# Patient Record
Sex: Female | Born: 1935
Health system: Southern US, Community
[De-identification: ages and names within clinical notes are randomized; demographics above are authoritative.]

## PROBLEM LIST (undated history)

## (undated) ENCOUNTER — Emergency Department (HOSPITAL_COMMUNITY): Disposition: A | Discharge status: Home / Self Care | Payer: No Typology Code available for payment source

## (undated) DIAGNOSIS — Z9889 Other specified postprocedural states: Secondary | ICD-10-CM

## (undated) DIAGNOSIS — R6883 Chills (without fever): Secondary | ICD-10-CM

## (undated) DIAGNOSIS — D689 Coagulation defect, unspecified: Secondary | ICD-10-CM

## (undated) DIAGNOSIS — R197 Diarrhea, unspecified: Secondary | ICD-10-CM

## (undated) DIAGNOSIS — R112 Nausea with vomiting, unspecified: Secondary | ICD-10-CM

## (undated) DIAGNOSIS — I10 Essential (primary) hypertension: Secondary | ICD-10-CM

## (undated) DIAGNOSIS — R238 Other skin changes: Secondary | ICD-10-CM

## (undated) DIAGNOSIS — M7989 Other specified soft tissue disorders: Secondary | ICD-10-CM

## (undated) DIAGNOSIS — C50919 Malignant neoplasm of unspecified site of unspecified female breast: Secondary | ICD-10-CM

## (undated) DIAGNOSIS — D649 Anemia, unspecified: Secondary | ICD-10-CM

## (undated) DIAGNOSIS — R0602 Shortness of breath: Secondary | ICD-10-CM

## (undated) DIAGNOSIS — R51 Headache: Secondary | ICD-10-CM

## (undated) DIAGNOSIS — M1990 Unspecified osteoarthritis, unspecified site: Secondary | ICD-10-CM

## (undated) DIAGNOSIS — E079 Disorder of thyroid, unspecified: Secondary | ICD-10-CM

## (undated) HISTORY — DX: Other specified soft tissue disorders: M79.89

## (undated) HISTORY — PX: EYE SURGERY: SHX253

## (undated) HISTORY — DX: Chills (without fever): R68.83

## (undated) HISTORY — DX: Other skin changes: R23.8

## (undated) HISTORY — DX: Malignant neoplasm of unspecified site of unspecified female breast: C50.919

## (undated) HISTORY — DX: Diarrhea, unspecified: R19.7

## (undated) HISTORY — DX: Disorder of thyroid, unspecified: E07.9

## (undated) HISTORY — DX: Coagulation defect, unspecified: D68.9

## (undated) HISTORY — DX: Headache: R51

## (undated) HISTORY — DX: Anemia, unspecified: D64.9

## (undated) HISTORY — DX: Unspecified osteoarthritis, unspecified site: M19.90

## (undated) HISTORY — DX: Essential (primary) hypertension: I10

---

## 1958-05-08 HISTORY — PX: VEIN LIGATION AND STRIPPING: SHX2653

## 1968-05-08 HISTORY — PX: CHOLECYSTECTOMY: SHX55

## 1973-05-08 HISTORY — PX: UTERINE FIBROID SURGERY: SHX826

## 1998-05-08 HISTORY — PX: ANKLE SURGERY: SHX546

## 1998-05-08 HISTORY — PX: WRIST SURGERY: SHX841

## 1998-08-25 ENCOUNTER — Encounter: Payer: Self-pay | Admitting: Orthopaedic Surgery

## 1998-08-25 ENCOUNTER — Inpatient Hospital Stay (HOSPITAL_COMMUNITY): Admission: AD | Admit: 1998-08-25 | Discharge: 1998-08-28 | Payer: Self-pay | Admitting: Orthopaedic Surgery

## 1999-09-25 ENCOUNTER — Emergency Department (HOSPITAL_COMMUNITY): Admission: EM | Admit: 1999-09-25 | Discharge: 1999-09-25 | Payer: Self-pay | Admitting: Emergency Medicine

## 1999-09-27 ENCOUNTER — Encounter: Admission: RE | Admit: 1999-09-27 | Discharge: 1999-09-27 | Payer: Self-pay | Admitting: Gastroenterology

## 1999-09-27 ENCOUNTER — Encounter: Payer: Self-pay | Admitting: Gastroenterology

## 1999-12-06 ENCOUNTER — Encounter: Payer: Self-pay | Admitting: Obstetrics and Gynecology

## 1999-12-06 ENCOUNTER — Encounter: Admission: RE | Admit: 1999-12-06 | Discharge: 1999-12-06 | Payer: Self-pay | Admitting: Obstetrics and Gynecology

## 2000-09-13 ENCOUNTER — Other Ambulatory Visit: Admission: RE | Admit: 2000-09-13 | Discharge: 2000-09-13 | Payer: Self-pay | Admitting: Obstetrics and Gynecology

## 2000-10-04 ENCOUNTER — Encounter: Payer: Self-pay | Admitting: Obstetrics and Gynecology

## 2000-10-04 ENCOUNTER — Encounter: Admission: RE | Admit: 2000-10-04 | Discharge: 2000-10-04 | Payer: Self-pay | Admitting: Obstetrics and Gynecology

## 2001-03-01 ENCOUNTER — Encounter: Admission: RE | Admit: 2001-03-01 | Discharge: 2001-03-01 | Payer: Self-pay | Admitting: Obstetrics and Gynecology

## 2001-03-01 ENCOUNTER — Encounter: Payer: Self-pay | Admitting: Obstetrics and Gynecology

## 2001-09-19 ENCOUNTER — Other Ambulatory Visit: Admission: RE | Admit: 2001-09-19 | Discharge: 2001-09-19 | Payer: Self-pay | Admitting: Gynecology

## 2002-02-25 ENCOUNTER — Encounter: Admission: RE | Admit: 2002-02-25 | Discharge: 2002-02-25 | Payer: Self-pay | Admitting: Gynecology

## 2002-02-25 ENCOUNTER — Encounter: Payer: Self-pay | Admitting: Gynecology

## 2002-10-28 ENCOUNTER — Other Ambulatory Visit: Admission: RE | Admit: 2002-10-28 | Discharge: 2002-10-28 | Payer: Self-pay | Admitting: Gynecology

## 2003-05-19 ENCOUNTER — Encounter: Admission: RE | Admit: 2003-05-19 | Discharge: 2003-05-19 | Payer: Self-pay | Admitting: Gynecology

## 2003-10-29 ENCOUNTER — Other Ambulatory Visit: Admission: RE | Admit: 2003-10-29 | Discharge: 2003-10-29 | Payer: Self-pay | Admitting: Gynecology

## 2004-09-23 ENCOUNTER — Emergency Department (HOSPITAL_COMMUNITY): Admission: EM | Admit: 2004-09-23 | Discharge: 2004-09-23 | Payer: Self-pay | Admitting: Emergency Medicine

## 2004-09-27 ENCOUNTER — Ambulatory Visit (HOSPITAL_BASED_OUTPATIENT_CLINIC_OR_DEPARTMENT_OTHER): Admission: RE | Admit: 2004-09-27 | Discharge: 2004-09-27 | Payer: Self-pay | Admitting: Orthopedic Surgery

## 2004-09-27 ENCOUNTER — Ambulatory Visit (HOSPITAL_COMMUNITY): Admission: RE | Admit: 2004-09-27 | Discharge: 2004-09-27 | Payer: Self-pay | Admitting: Orthopedic Surgery

## 2004-10-18 ENCOUNTER — Encounter: Admission: RE | Admit: 2004-10-18 | Discharge: 2004-10-18 | Payer: Self-pay | Admitting: Gynecology

## 2004-10-31 ENCOUNTER — Other Ambulatory Visit: Admission: RE | Admit: 2004-10-31 | Discharge: 2004-10-31 | Payer: Self-pay | Admitting: Gynecology

## 2005-12-01 ENCOUNTER — Encounter: Admission: RE | Admit: 2005-12-01 | Discharge: 2005-12-01 | Payer: Self-pay | Admitting: Gynecology

## 2005-12-01 ENCOUNTER — Other Ambulatory Visit: Admission: RE | Admit: 2005-12-01 | Discharge: 2005-12-01 | Payer: Self-pay | Admitting: Gynecology

## 2006-11-05 ENCOUNTER — Other Ambulatory Visit: Admission: RE | Admit: 2006-11-05 | Discharge: 2006-11-05 | Payer: Self-pay | Admitting: Gynecology

## 2006-12-04 ENCOUNTER — Encounter: Admission: RE | Admit: 2006-12-04 | Discharge: 2006-12-04 | Payer: Self-pay | Admitting: Gynecology

## 2007-12-10 ENCOUNTER — Encounter: Admission: RE | Admit: 2007-12-10 | Discharge: 2007-12-10 | Payer: Self-pay | Admitting: Gynecology

## 2009-02-22 ENCOUNTER — Encounter: Admission: RE | Admit: 2009-02-22 | Discharge: 2009-02-22 | Payer: Self-pay | Admitting: Gynecology

## 2010-05-28 ENCOUNTER — Other Ambulatory Visit: Payer: Self-pay | Admitting: Gynecology

## 2010-05-28 ENCOUNTER — Encounter: Payer: Self-pay | Admitting: Internal Medicine

## 2010-05-28 DIAGNOSIS — Z1239 Encounter for other screening for malignant neoplasm of breast: Secondary | ICD-10-CM

## 2010-05-29 ENCOUNTER — Encounter: Payer: Self-pay | Admitting: Gynecology

## 2010-05-30 ENCOUNTER — Encounter: Payer: Self-pay | Admitting: Gynecology

## 2010-06-22 ENCOUNTER — Ambulatory Visit: Payer: Self-pay

## 2010-06-29 ENCOUNTER — Ambulatory Visit: Payer: Self-pay

## 2010-07-28 ENCOUNTER — Ambulatory Visit
Admission: RE | Admit: 2010-07-28 | Discharge: 2010-07-28 | Disposition: A | Discharge status: Home / Self Care | Payer: Medicare Other | Source: Ambulatory Visit | Attending: Gynecology | Admitting: Gynecology

## 2010-07-28 DIAGNOSIS — Z1239 Encounter for other screening for malignant neoplasm of breast: Secondary | ICD-10-CM

## 2010-09-23 NOTE — Op Note (Signed)
NAME:  CHENAY, NESMITH                  ACCOUNT NO.:  [id number]   MEDICAL RECORD NO.:  [id number]          PATIENT TYPE:  AMB   LOCATION:  DSC                          FACILITY:  [facility]   PHYSICIAN:  Cindee Salt, M.D.       DATE OF BIRTH:  Jul 24, 1935   DATE OF PROCEDURE:  09/27/2004  DATE OF DISCHARGE:                                 OPERATIVE REPORT   PREOPERATIVE DIAGNOSIS:  Distal radius fracture, right wrist.   POSTOPERATIVE DIAGNOSIS:  Distal radius fracture, right wrist.   OPERATION:  Open reduction internal fixation, right distal radius fracture.   SURGEON:  Cindee Salt, M.D.   ASSISTANTCarolyne Fiscal, R.N.   ANESTHESIA:  General.   HISTORY:  The patient is a 75 year old female who suffered a fall from a  step ladder, suffering a comminuted intra-articular fracture of the distal  right radius. She is admitted now for open reduction internal fixation.   PROCEDURE:  The patient is brought to the operating room where a general  anesthetic was carried out without difficulty. She was prepped using  DuraPrep, supine position, right arm free.  The fracture was manipulated  reduced, checked under image intensification showing adequate reduction,  marked comminution of the fracture.  The limb was then exsanguinated with an  Esmarch bandage.  A tourniquet placed high on the arm was inflated to 250  mmHg. The volar incision was made over the flexor carpi radialis carried  down through subcutaneous tissue. Bleeders were electrocauterized.  Dissection carried through the flexor carpi radialis sheath.  The radial  artery identified and protected. The retractors were placed allowing  dissection of the FPL off from the pronator quadratus.  The pronator  quadratus was then incised on its radial aspect maintaining cuff tissue for  later repair.  The brachioradialis was then released. The fracture was  easily identified, minimally curetted and a regular right PDR plate was then  selected.  The  fracture maintained in position. This was then pinned with  multiple 45 K-wires. X-rays revealed adequate reduction of the fracture  fragment.  A Freer elevator was used to elevate the radial styloid  maintaining it in position with an articular surface being maintained.  A  screw was then placed into the variable hole.  This measured 16 mm. This  firmly fixed the proximal plate in position. The pin was removed proximally.  The distal plates and screws were then each inserted.  These measured  between 18 and 22 mm. These were each placed as either pegs or terminally  threaded pegs, locking fragment in position. AP lateral oblique x-rays  through the articular surface revealed pins were not in the articular  surface.  None were in the joint. The wrist was able to be placed through a  full range motion, full pronation, supination was afforded.  The proximal  screws were then placed.  These were each measured and tapped.  The most  distal screw measured 16, the 16 mm screw was noted be long and this was  replaced with a 14 and two 14 mm screws  placed proximally. The 16 mm screw  was then inserted distally.  X-rays AP lateral oblique revealed the fracture  reduced in both AP lateral oblique directions without significant bony  defect.  As such, bone graft was not used.  The wound was copiously  irrigated with saline. The pronator quadratus was then repaired with figure-  of-eight 4-0 Vicryl sutures, the subcutaneous tissue with 4-0 Vicryl and  skin with interrupted 5-0 nylon  sutures. A sterile dorsal palmar splint was applied. The patient tolerated  the procedure well and was taken to the recovery room for observation in  satisfactory condition. She is admitted for overnight stay. She will be  discharged on Percocet.      GK/MEDQ  D:  09/27/2004  T:  09/27/2004  Job:  [id number]

## 2011-03-29 ENCOUNTER — Other Ambulatory Visit: Payer: Self-pay | Admitting: Gynecology

## 2011-03-29 DIAGNOSIS — N63 Unspecified lump in unspecified breast: Secondary | ICD-10-CM

## 2011-04-12 ENCOUNTER — Ambulatory Visit
Admission: RE | Admit: 2011-04-12 | Discharge: 2011-04-12 | Disposition: A | Discharge status: Home / Self Care | Payer: Medicare Other | Source: Ambulatory Visit | Attending: Gynecology | Admitting: Gynecology

## 2011-04-12 ENCOUNTER — Other Ambulatory Visit: Payer: Self-pay | Admitting: Gynecology

## 2011-04-12 DIAGNOSIS — N63 Unspecified lump in unspecified breast: Secondary | ICD-10-CM

## 2011-04-21 ENCOUNTER — Other Ambulatory Visit: Payer: Self-pay | Admitting: Gynecology

## 2011-04-21 ENCOUNTER — Ambulatory Visit
Admission: RE | Admit: 2011-04-21 | Discharge: 2011-04-21 | Disposition: A | Discharge status: Home / Self Care | Payer: Medicare Other | Source: Ambulatory Visit | Attending: Gynecology | Admitting: Gynecology

## 2011-04-21 DIAGNOSIS — C50919 Malignant neoplasm of unspecified site of unspecified female breast: Secondary | ICD-10-CM | POA: Insufficient documentation

## 2011-04-21 DIAGNOSIS — N63 Unspecified lump in unspecified breast: Secondary | ICD-10-CM

## 2011-04-21 HISTORY — DX: Malignant neoplasm of unspecified site of unspecified female breast: C50.919

## 2011-04-24 ENCOUNTER — Telehealth (INDEPENDENT_AMBULATORY_CARE_PROVIDER_SITE_OTHER): Payer: Self-pay | Admitting: Surgery

## 2011-04-24 ENCOUNTER — Other Ambulatory Visit: Payer: Self-pay | Admitting: Gynecology

## 2011-04-24 ENCOUNTER — Ambulatory Visit
Admission: RE | Admit: 2011-04-24 | Discharge: 2011-04-24 | Disposition: A | Discharge status: Home / Self Care | Payer: Medicare Other | Source: Ambulatory Visit | Attending: Gynecology | Admitting: Gynecology

## 2011-04-24 DIAGNOSIS — N63 Unspecified lump in unspecified breast: Secondary | ICD-10-CM

## 2011-04-24 DIAGNOSIS — N6489 Other specified disorders of breast: Secondary | ICD-10-CM

## 2011-04-24 DIAGNOSIS — C50912 Malignant neoplasm of unspecified site of left female breast: Secondary | ICD-10-CM

## 2011-04-24 NOTE — Telephone Encounter (Signed)
Dr Jamey Ripa please advise of where to move? This is a new breast cancer.

## 2011-04-24 NOTE — Telephone Encounter (Signed)
Appt moved to Friday 04/28/11 @ 12:20pm. Patient aware.

## 2011-04-24 NOTE — Telephone Encounter (Signed)
Needs sooner appt if possible, please call.

## 2011-04-25 ENCOUNTER — Telehealth (INDEPENDENT_AMBULATORY_CARE_PROVIDER_SITE_OTHER): Payer: Self-pay | Admitting: General Surgery

## 2011-04-25 NOTE — Telephone Encounter (Signed)
Patient called and was transferred to my voicemail. She is a new breast cancer. States she can't come in on her appt date 04/28/11 and needs to reschedule. I tried to call patient back and no answer and no way to leave a message.

## 2011-04-26 ENCOUNTER — Telehealth (INDEPENDENT_AMBULATORY_CARE_PROVIDER_SITE_OTHER): Payer: Self-pay | Admitting: Surgery

## 2011-04-26 NOTE — Telephone Encounter (Signed)
Duplicate phone message. I already have an open phone message where I have tried to reach this patient several times. No answer on her phone and no way to leave a message. Please reschedule this appt or get this patient to me when she calls.

## 2011-04-28 ENCOUNTER — Other Ambulatory Visit: Payer: Medicare Other

## 2011-04-28 ENCOUNTER — Encounter (INDEPENDENT_AMBULATORY_CARE_PROVIDER_SITE_OTHER): Payer: Medicare Other | Admitting: Surgery

## 2011-05-04 ENCOUNTER — Encounter (INDEPENDENT_AMBULATORY_CARE_PROVIDER_SITE_OTHER): Payer: Self-pay | Admitting: Surgery

## 2011-05-05 ENCOUNTER — Ambulatory Visit
Admission: RE | Admit: 2011-05-05 | Discharge: 2011-05-05 | Disposition: A | Discharge status: Home / Self Care | Payer: Medicare Other | Source: Ambulatory Visit | Attending: Gynecology | Admitting: Gynecology

## 2011-05-05 ENCOUNTER — Other Ambulatory Visit: Payer: Self-pay | Admitting: Gynecology

## 2011-05-05 ENCOUNTER — Ambulatory Visit: Admission: RE | Admit: 2011-05-05 | Payer: Medicare Other | Source: Ambulatory Visit

## 2011-05-05 DIAGNOSIS — N6489 Other specified disorders of breast: Secondary | ICD-10-CM

## 2011-05-06 ENCOUNTER — Other Ambulatory Visit: Payer: Medicare Other

## 2011-05-08 ENCOUNTER — Encounter (INDEPENDENT_AMBULATORY_CARE_PROVIDER_SITE_OTHER): Payer: Self-pay | Admitting: Surgery

## 2011-05-08 ENCOUNTER — Encounter (INDEPENDENT_AMBULATORY_CARE_PROVIDER_SITE_OTHER): Payer: Medicare Other | Admitting: Surgery

## 2011-05-08 ENCOUNTER — Ambulatory Visit (INDEPENDENT_AMBULATORY_CARE_PROVIDER_SITE_OTHER): Payer: Medicare Other | Admitting: Surgery

## 2011-05-08 VITALS — BP 194/122 | HR 68 | Temp 97.9°F | Ht 66.0 in | Wt 165.0 lb

## 2011-05-08 DIAGNOSIS — C50919 Malignant neoplasm of unspecified site of unspecified female breast: Secondary | ICD-10-CM

## 2011-05-08 NOTE — Progress Notes (Signed)
Patient ID: Chloe Watkins, female   DOB: 28-Sep-1935, 75 y.o.   MRN: [id number]  Chief Complaint  Patient presents with  . Breast Cancer    eval Lt br CA    HPI Chloe Watkins is a 75 y.o. female.  He was recently diagnosed with a left breast cancer upper outer quadrant. She does have dysuria has been "scar tissue "followed by Dr. Nicholas Watkins for several years. She had a mammogram in the springtime that was negative. However Dr. Nicholas Watkins recently found a mass in this very same area. This apparently was a different physical finding from that which has been present for a long time. Mammogram and ultrasound were done showing about a 1.5 cm mass. Core biopsy has been done showing invasive ductal carcinoma with some DCIS identified. It strongly receptor positive with a Ki-67 of 27% the HER-2/neu was negative.  Other than having this irregular area of breast tissue, the patient had no breast symptoms or other breast problems. She has a negative family history for breast cancer. HPI  Past Medical History  Diagnosis Date  . Breast cancer 04/21/2011  . Anemia   . Arthritis   . Anxiety   . Clotting disorder   . Hypertension   . Osteoporosis   . Thyroid disease   . Chills   . Hearing loss   . Leg swelling   . Diarrhea   . Arthritis pain   . Headache   . Bruises easily     Past Surgical History  Procedure Date  . Vein ligation and stripping 1960  . Cholecystectomy 1970  . Uterine fibroid surgery 1975  . Ankle surgery 2000  . Wrist surgery 2000    right    History reviewed. No pertinent family history.  Social History History  Substance Use Topics  . Smoking status: Former Smoker    Quit date: 05/07/1981  . Smokeless tobacco: Not on file  . Alcohol Use: No    Allergies  Allergen Reactions  . Penicillins Itching and Swelling    Current Outpatient Prescriptions  Medication Sig Dispense Refill  . atenolol (TENORMIN) 50 MG tablet Take 50 mg by mouth daily.          Review of  Systems Review of Systems  Constitutional: Positive for chills. Negative for fever and unexpected weight change.  HENT: Negative for hearing loss, congestion, sore throat, trouble swallowing and voice change.   Eyes: Negative for visual disturbance.  Respiratory: Negative for cough and wheezing.   Cardiovascular: Negative for chest pain, palpitations and leg swelling.  Gastrointestinal: Positive for diarrhea. Negative for nausea, vomiting, abdominal pain, constipation, blood in stool, abdominal distention and anal bleeding.  Genitourinary: Negative for hematuria, vaginal bleeding and difficulty urinating.  Musculoskeletal: Negative for arthralgias.  Skin: Negative for rash and wound.  Neurological: Positive for headaches. Negative for seizures and syncope.  Hematological: Negative for adenopathy. Bruises/bleeds easily.  Psychiatric/Behavioral: Negative for confusion.    Blood pressure 194/122, pulse 68, temperature 97.9 F (36.6 C), temperature source Temporal, height 5\' 6"  (1.676 m), weight 165 lb (74.844 kg), SpO2 98.00%.  Physical Exam Physical Exam  Vitals reviewed. Constitutional: She is oriented to person, place, and time. She appears well-developed and well-nourished. No distress.  HENT:  Head: Normocephalic and atraumatic.  Mouth/Throat: Oropharynx is clear and moist.  Eyes: Conjunctivae and EOM are normal. Pupils are equal, round, and reactive to light. No scleral icterus.  Neck: Normal range of motion. Neck supple. No tracheal deviation present. No  thyromegaly present.  Cardiovascular: Normal rate, regular rhythm, normal heart sounds and intact distal pulses.  Exam reveals no gallop and no friction rub.   No murmur heard. Pulmonary/Chest: Effort normal and breath sounds normal. No respiratory distress. She has no wheezes. She has no rales.    Abdominal: Soft. Bowel sounds are normal. She exhibits no distension and no mass. There is no tenderness. There is no rebound and no  guarding.  Musculoskeletal: Normal range of motion. She exhibits no edema and no tenderness.  Neurological: She is alert and oriented to person, place, and time.  Skin: Skin is warm and dry. No rash noted. She is not diaphoretic. No erythema.  Psychiatric: She has a normal mood and affect. Her behavior is normal. Judgment and thought content normal.  Subtle Left breast mass as noted above  Lymphatics: No axillary or supraclavicular adenopathy  Data Reviewed Mammogram, sono, and path all reviewed  Assessment    Clinical stage I left breast cancer UOQ    Plan    I had a long talk with the patient about her diagnosis and went over options for treatment. I gave her our booklet on breast cancer and went over that with her. I told her currently she looks like a stage I by size with clinically negative lymph nodes. I think she would be a candidate for lumpectomy.  Also told her that if the MRI shows a larger area that we think she may need to have a mastectomy but hopefully not. I discussed the risks and complications of surgery.  The patient lives alone so I think she will need to spend the night after surgery.  The palpable mass is very subtle. I think we may want to put a guidewire into localized at the time of her surgery, assuming we do a lumpectomy.       Anicka Stuckert J 05/08/2011, 1:21 PM

## 2011-05-08 NOTE — Patient Instructions (Signed)
We will follow up with you by telephone when we have the results of your MRI

## 2011-05-10 ENCOUNTER — Telehealth (INDEPENDENT_AMBULATORY_CARE_PROVIDER_SITE_OTHER): Payer: Self-pay | Admitting: General Surgery

## 2011-05-10 DIAGNOSIS — C50912 Malignant neoplasm of unspecified site of left female breast: Secondary | ICD-10-CM

## 2011-05-10 NOTE — Telephone Encounter (Signed)
Per [name] patient needs scheduled for MR breast. Tried to put in order and it was a duplicate. Released order that was entered by Dr Nicholas Lose.

## 2011-05-11 ENCOUNTER — Other Ambulatory Visit: Payer: Medicare Other

## 2011-05-11 ENCOUNTER — Other Ambulatory Visit (HOSPITAL_COMMUNITY): Payer: Medicare Other

## 2011-05-14 ENCOUNTER — Other Ambulatory Visit: Payer: Medicare Other

## 2011-05-15 ENCOUNTER — Other Ambulatory Visit: Payer: Medicare Other

## 2011-05-15 ENCOUNTER — Ambulatory Visit
Admission: RE | Admit: 2011-05-15 | Discharge: 2011-05-15 | Disposition: A | Discharge status: Home / Self Care | Payer: Medicare Other | Source: Ambulatory Visit | Attending: Gynecology | Admitting: Gynecology

## 2011-05-15 ENCOUNTER — Other Ambulatory Visit (HOSPITAL_COMMUNITY): Payer: Medicare Other

## 2011-05-15 MED ORDER — GADOBENATE DIMEGLUMINE 529 MG/ML IV SOLN
15.0000 mL | Freq: Once | INTRAVENOUS | Status: AC | PRN
  Administered 2011-05-15: 15 mL via INTRAVENOUS

## 2011-05-16 ENCOUNTER — Other Ambulatory Visit (INDEPENDENT_AMBULATORY_CARE_PROVIDER_SITE_OTHER): Payer: Self-pay | Admitting: Surgery

## 2011-05-16 ENCOUNTER — Telehealth (INDEPENDENT_AMBULATORY_CARE_PROVIDER_SITE_OTHER): Payer: Self-pay | Admitting: Surgery

## 2011-05-16 DIAGNOSIS — C50219 Malignant neoplasm of upper-inner quadrant of unspecified female breast: Secondary | ICD-10-CM

## 2011-05-16 NOTE — Telephone Encounter (Signed)
I talked to her by telephone today. I told her that the MRI shows only one known cancer. She is a good candidate for a lumpectomy and a sentinel lymph node evaluation. Because the area is subtle to palpation I recommended that a guidewire be placed preoperatively. I went over risks and complications briefly again and we will go ahead and try to get her scheduled.I have discussed the indications for the lumpectomy and described the procedure. She understand that the chance of removal of the abnormal area is very good, but that occasionally we are unable to locate it and may have to do a second procedure. We also discussed the possibility of a second procedure to get additional tissue. Risks of surgery such as bleeding and infection have also been explained, as well as the implications of not doing the surgery. She understands and wishes to proceed.

## 2011-05-19 ENCOUNTER — Encounter (HOSPITAL_BASED_OUTPATIENT_CLINIC_OR_DEPARTMENT_OTHER): Payer: Self-pay | Admitting: *Deleted

## 2011-05-19 NOTE — Progress Notes (Signed)
To come in for ekg,cxr,labs ua-bring all meds and overnight bag dos-has stayed here in past

## 2011-05-22 ENCOUNTER — Other Ambulatory Visit: Payer: Self-pay

## 2011-05-22 ENCOUNTER — Encounter (HOSPITAL_BASED_OUTPATIENT_CLINIC_OR_DEPARTMENT_OTHER)
Admission: RE | Admit: 2011-05-22 | Discharge: 2011-05-22 | Disposition: A | Discharge status: Home / Self Care | Payer: Medicare Other | Source: Ambulatory Visit | Attending: Surgery | Admitting: Surgery

## 2011-05-22 ENCOUNTER — Ambulatory Visit
Admission: RE | Admit: 2011-05-22 | Discharge: 2011-05-22 | Disposition: A | Discharge status: Home / Self Care | Payer: Medicare Other | Source: Ambulatory Visit | Attending: Surgery | Admitting: Surgery

## 2011-05-22 LAB — URINALYSIS, ROUTINE W REFLEX MICROSCOPIC
Bilirubin Urine: NEGATIVE
Ketones, ur: NEGATIVE mg/dL
Nitrite: NEGATIVE
Protein, ur: NEGATIVE mg/dL
pH: 6 (ref 5.0–8.0)

## 2011-05-22 LAB — COMPREHENSIVE METABOLIC PANEL
ALT: 18 U/L (ref 0–35)
AST: 22 U/L (ref 0–37)
Alkaline Phosphatase: 87 U/L (ref 39–117)
CO2: 28 mEq/L (ref 19–32)
Calcium: 9.4 mg/dL (ref 8.4–10.5)
Chloride: 99 mEq/L (ref 96–112)
GFR calc non Af Amer: 59 mL/min — ABNORMAL LOW (ref >90)
Glucose, Bld: 107 mg/dL — ABNORMAL HIGH (ref 70–99)
Potassium: 3.9 mEq/L (ref 3.5–5.1)
Sodium: 137 mEq/L (ref 135–145)
Total Bilirubin: 0.5 mg/dL (ref 0.3–1.2)

## 2011-05-22 LAB — URINE MICROSCOPIC-ADD ON

## 2011-05-22 LAB — CANCER ANTIGEN 27.29: CA 27.29: 22 U/mL (ref 0–39)

## 2011-05-22 LAB — CBC
Hemoglobin: 14.5 g/dL (ref 12.0–15.0)
Platelets: 227 10*3/uL (ref 150–400)
RBC: 4.9 MIL/uL (ref 3.87–5.11)
WBC: 8.6 10*3/uL (ref 4.0–10.5)

## 2011-05-22 LAB — DIFFERENTIAL
Basophils Absolute: 0 10*3/uL (ref 0.0–0.1)
Eosinophils Absolute: 0.2 10*3/uL (ref 0.0–0.7)
Eosinophils Relative: 2 % (ref 0–5)
Lymphocytes Relative: 28 % (ref 12–46)
Monocytes Absolute: 1 10*3/uL (ref 0.1–1.0)

## 2011-05-25 ENCOUNTER — Encounter (HOSPITAL_BASED_OUTPATIENT_CLINIC_OR_DEPARTMENT_OTHER): Payer: Self-pay | Admitting: Anesthesiology

## 2011-05-25 ENCOUNTER — Ambulatory Visit (HOSPITAL_BASED_OUTPATIENT_CLINIC_OR_DEPARTMENT_OTHER)
Admission: RE | Admit: 2011-05-25 | Discharge: 2011-05-26 | Disposition: A | Discharge status: Home / Self Care | Payer: Medicare Other | Source: Ambulatory Visit | Attending: Surgery | Admitting: Surgery

## 2011-05-25 ENCOUNTER — Ambulatory Visit (HOSPITAL_BASED_OUTPATIENT_CLINIC_OR_DEPARTMENT_OTHER): Payer: Medicare Other | Admitting: Anesthesiology

## 2011-05-25 ENCOUNTER — Other Ambulatory Visit (INDEPENDENT_AMBULATORY_CARE_PROVIDER_SITE_OTHER): Payer: Self-pay | Admitting: Surgery

## 2011-05-25 ENCOUNTER — Encounter (HOSPITAL_BASED_OUTPATIENT_CLINIC_OR_DEPARTMENT_OTHER)
Admission: RE | Disposition: A | Discharge status: Home / Self Care | Payer: Self-pay | Source: Ambulatory Visit | Attending: Surgery

## 2011-05-25 ENCOUNTER — Encounter (HOSPITAL_BASED_OUTPATIENT_CLINIC_OR_DEPARTMENT_OTHER): Payer: Self-pay

## 2011-05-25 ENCOUNTER — Ambulatory Visit
Admission: RE | Admit: 2011-05-25 | Discharge: 2011-05-25 | Disposition: A | Discharge status: Home / Self Care | Payer: Medicare Other | Source: Ambulatory Visit | Attending: Surgery | Admitting: Surgery

## 2011-05-25 ENCOUNTER — Ambulatory Visit (HOSPITAL_COMMUNITY)
Admission: RE | Admit: 2011-05-25 | Discharge: 2011-05-25 | Disposition: A | Discharge status: Home / Self Care | Payer: Medicare Other | Source: Ambulatory Visit | Attending: Surgery | Admitting: Surgery

## 2011-05-25 ENCOUNTER — Other Ambulatory Visit (HOSPITAL_COMMUNITY): Payer: Medicare Other

## 2011-05-25 DIAGNOSIS — Z01810 Encounter for preprocedural cardiovascular examination: Secondary | ICD-10-CM | POA: Insufficient documentation

## 2011-05-25 DIAGNOSIS — C50219 Malignant neoplasm of upper-inner quadrant of unspecified female breast: Secondary | ICD-10-CM

## 2011-05-25 DIAGNOSIS — C50419 Malignant neoplasm of upper-outer quadrant of unspecified female breast: Secondary | ICD-10-CM | POA: Insufficient documentation

## 2011-05-25 DIAGNOSIS — I10 Essential (primary) hypertension: Secondary | ICD-10-CM | POA: Insufficient documentation

## 2011-05-25 DIAGNOSIS — C50919 Malignant neoplasm of unspecified site of unspecified female breast: Secondary | ICD-10-CM

## 2011-05-25 DIAGNOSIS — Z01812 Encounter for preprocedural laboratory examination: Secondary | ICD-10-CM | POA: Insufficient documentation

## 2011-05-25 HISTORY — PX: BREAST LUMPECTOMY: SHX2

## 2011-05-25 SURGERY — BREAST LUMPECTOMY WITH SENTINEL LYMPH NODE BX
Anesthesia: General | Site: Breast | Wound class: Clean

## 2011-05-25 MED ORDER — ACETAMINOPHEN 10 MG/ML IV SOLN
1000.0000 mg | Freq: Once | INTRAVENOUS | Status: AC
  Administered 2011-05-25: 1000 mg via INTRAVENOUS

## 2011-05-25 MED ORDER — SODIUM CHLORIDE 0.9 % IJ SOLN: 3.0000 mL | INTRAMUSCULAR | Status: DC | PRN

## 2011-05-25 MED ORDER — ONDANSETRON HCL 4 MG/2ML IJ SOLN
INTRAMUSCULAR | Status: DC | PRN
  Administered 2011-05-25: 4 mg via INTRAVENOUS

## 2011-05-25 MED ORDER — TECHNETIUM TC 99M SULFUR COLLOID FILTERED
1.0000 | Freq: Once | INTRAVENOUS | Status: AC | PRN
  Administered 2011-05-25: 1 via INTRADERMAL

## 2011-05-25 MED ORDER — ATENOLOL 50 MG PO TABS
50.0000 mg | ORAL_TABLET | Freq: Every day | ORAL | Status: DC
  Administered 2011-05-25: 50 mg via ORAL

## 2011-05-25 MED ORDER — EPHEDRINE SULFATE 50 MG/ML IJ SOLN
INTRAMUSCULAR | Status: DC | PRN
  Administered 2011-05-25: 10 mg via INTRAVENOUS

## 2011-05-25 MED ORDER — CHLORHEXIDINE GLUCONATE 4 % EX LIQD: 1.0000 "application " | Freq: Once | CUTANEOUS | Status: DC

## 2011-05-25 MED ORDER — SODIUM CHLORIDE 0.9 % IJ SOLN
INTRAMUSCULAR | Status: DC | PRN
  Administered 2011-05-25: 3 mL

## 2011-05-25 MED ORDER — METOCLOPRAMIDE HCL 5 MG/ML IJ SOLN: 10.0000 mg | Freq: Once | INTRAMUSCULAR | Status: DC | PRN

## 2011-05-25 MED ORDER — DEXAMETHASONE SODIUM PHOSPHATE 4 MG/ML IJ SOLN
INTRAMUSCULAR | Status: DC | PRN
  Administered 2011-05-25: 10 mg via INTRAVENOUS

## 2011-05-25 MED ORDER — PROPOFOL 10 MG/ML IV EMUL
INTRAVENOUS | Status: DC | PRN
  Administered 2011-05-25 (×2): 20 mg via INTRAVENOUS
  Administered 2011-05-25: 120 mg via INTRAVENOUS

## 2011-05-25 MED ORDER — MIDAZOLAM HCL 2 MG/2ML IJ SOLN
1.0000 mg | INTRAMUSCULAR | Status: DC | PRN
  Administered 2011-05-25: 2 mg via INTRAVENOUS

## 2011-05-25 MED ORDER — CIPROFLOXACIN IN D5W 400 MG/200ML IV SOLN
400.0000 mg | INTRAVENOUS | Status: AC
  Administered 2011-05-25: 400 mg via INTRAVENOUS

## 2011-05-25 MED ORDER — ONDANSETRON HCL 4 MG/2ML IJ SOLN: 4.0000 mg | Freq: Four times a day (QID) | INTRAMUSCULAR | Status: DC | PRN

## 2011-05-25 MED ORDER — PROMETHAZINE HCL 25 MG/ML IJ SOLN
12.5000 mg | Freq: Four times a day (QID) | INTRAMUSCULAR | Status: DC | PRN
  Administered 2011-05-25: 12.5 mg via INTRAVENOUS

## 2011-05-25 MED ORDER — SODIUM CHLORIDE 0.9 % IV SOLN: 250.0000 mL | INTRAVENOUS | Status: DC | PRN

## 2011-05-25 MED ORDER — SUCCINYLCHOLINE CHLORIDE 20 MG/ML IJ SOLN
INTRAMUSCULAR | Status: DC | PRN
  Administered 2011-05-25: 80 mg via INTRAVENOUS

## 2011-05-25 MED ORDER — SODIUM CHLORIDE 0.9 % IJ SOLN: 3.0000 mL | Freq: Two times a day (BID) | INTRAMUSCULAR | Status: DC

## 2011-05-25 MED ORDER — BUPIVACAINE HCL (PF) 0.25 % IJ SOLN
INTRAMUSCULAR | Status: DC | PRN
  Administered 2011-05-25: 20 mL

## 2011-05-25 MED ORDER — FENTANYL CITRATE 0.05 MG/ML IJ SOLN
25.0000 ug | INTRAMUSCULAR | Status: DC | PRN
  Administered 2011-05-25: 50 ug via INTRAVENOUS
  Administered 2011-05-25 (×3): 25 ug via INTRAVENOUS

## 2011-05-25 MED ORDER — FENTANYL CITRATE 0.05 MG/ML IJ SOLN
50.0000 ug | INTRAMUSCULAR | Status: DC | PRN
  Administered 2011-05-25: 100 ug via INTRAVENOUS

## 2011-05-25 MED ORDER — FENTANYL CITRATE 0.05 MG/ML IJ SOLN
INTRAMUSCULAR | Status: DC | PRN
  Administered 2011-05-25 (×2): 50 ug via INTRAVENOUS
  Administered 2011-05-25: 25 ug via INTRAVENOUS

## 2011-05-25 MED ORDER — OXYCODONE HCL 5 MG PO TABS
5.0000 mg | ORAL_TABLET | ORAL | Status: DC | PRN
  Administered 2011-05-25 – 2011-05-26 (×3): 5 mg via ORAL

## 2011-05-25 MED ORDER — MORPHINE SULFATE 2 MG/ML IJ SOLN: 0.0500 mg/kg | INTRAMUSCULAR | Status: DC | PRN

## 2011-05-25 MED ORDER — DROPERIDOL 2.5 MG/ML IJ SOLN
INTRAMUSCULAR | Status: DC | PRN
  Administered 2011-05-25: 0.625 mg via INTRAVENOUS

## 2011-05-25 MED ORDER — LIDOCAINE HCL (CARDIAC) 20 MG/ML IV SOLN
INTRAVENOUS | Status: DC | PRN
  Administered 2011-05-25: 60 mg via INTRAVENOUS

## 2011-05-25 MED ORDER — LACTATED RINGERS IV SOLN
INTRAVENOUS | Status: DC
  Administered 2011-05-25 (×3): via INTRAVENOUS

## 2011-05-25 MED ORDER — METHYLENE BLUE 1 % INJ SOLN
INTRAMUSCULAR | Status: DC | PRN
  Administered 2011-05-25: 2 mL via SUBMUCOSAL

## 2011-05-25 MED ORDER — DEXTROSE IN LACTATED RINGERS 5 % IV SOLN
INTRAVENOUS | Status: DC
  Administered 2011-05-25: 14:00:00 via INTRAVENOUS

## 2011-05-25 SURGICAL SUPPLY — 60 items
ADH SKN CLS APL DERMABOND .7 (GAUZE/BANDAGES/DRESSINGS) ×2
APPLIER CLIP 11 MED OPEN (CLIP)
APPLIER CLIP 9.375 MED OPEN (MISCELLANEOUS)
APR CLP MED 11 20 MLT OPN (CLIP)
APR CLP MED 9.3 20 MLT OPN (MISCELLANEOUS)
BINDER BREAST XLRG (GAUZE/BANDAGES/DRESSINGS) ×1 IMPLANT
BLADE HEX COATED 2.75 (ELECTRODE) ×2 IMPLANT
BLADE SURG 15 STRL LF DISP TIS (BLADE) ×2 IMPLANT
BLADE SURG 15 STRL SS (BLADE) ×4
CANISTER SUCTION 1200CC (MISCELLANEOUS) ×2 IMPLANT
CHLORAPREP W/TINT 26ML (MISCELLANEOUS) ×2 IMPLANT
CLIP APPLIE 11 MED OPEN (CLIP) IMPLANT
CLIP APPLIE 9.375 MED OPEN (MISCELLANEOUS) IMPLANT
CLIP TI MEDIUM 6 (CLIP) ×1 IMPLANT
CLIP TI WIDE RED SMALL 6 (CLIP) ×2 IMPLANT
CLOTH BEACON ORANGE TIMEOUT ST (SAFETY) ×2 IMPLANT
COVER MAYO STAND STRL (DRAPES) ×2 IMPLANT
COVER PROBE W GEL 5X96 (DRAPES) ×2 IMPLANT
COVER TABLE BACK 60X90 (DRAPES) ×2 IMPLANT
DECANTER SPIKE VIAL GLASS SM (MISCELLANEOUS) IMPLANT
DERMABOND ADVANCED (GAUZE/BANDAGES/DRESSINGS) ×2
DERMABOND ADVANCED .7 DNX12 (GAUZE/BANDAGES/DRESSINGS) ×2 IMPLANT
DEVICE DUBIN W/COMP PLATE 8390 (MISCELLANEOUS) ×1 IMPLANT
DRAIN CHANNEL 19F RND (DRAIN) IMPLANT
DRAPE LAPAROSCOPIC ABDOMINAL (DRAPES) ×2 IMPLANT
DRAPE UTILITY XL STRL (DRAPES) ×2 IMPLANT
ELECT REM PT RETURN 9FT ADLT (ELECTROSURGICAL) ×2
ELECTRODE REM PT RTRN 9FT ADLT (ELECTROSURGICAL) ×1 IMPLANT
EVACUATOR SILICONE 100CC (DRAIN) IMPLANT
GLOVE BIO SURGEON STRL SZ7 (GLOVE) ×2 IMPLANT
GLOVE BIOGEL PI IND STRL 7.0 (GLOVE) IMPLANT
GLOVE BIOGEL PI INDICATOR 7.0 (GLOVE) ×2
GLOVE EUDERMIC 7 POWDERFREE (GLOVE) ×2 IMPLANT
GOWN PREVENTION PLUS XLARGE (GOWN DISPOSABLE) ×4 IMPLANT
KIT MARKER MARGIN INK (KITS) ×2 IMPLANT
NDL HYPO 25X1 1.5 SAFETY (NEEDLE) ×2 IMPLANT
NDL SAFETY ECLIPSE 18X1.5 (NEEDLE) ×1 IMPLANT
NEEDLE HYPO 18GX1.5 SHARP (NEEDLE) ×2
NEEDLE HYPO 25X1 1.5 SAFETY (NEEDLE) ×4 IMPLANT
NS IRRIG 1000ML POUR BTL (IV SOLUTION) ×2 IMPLANT
PACK BASIN DAY SURGERY FS (CUSTOM PROCEDURE TRAY) ×2 IMPLANT
PENCIL BUTTON HOLSTER BLD 10FT (ELECTRODE) ×2 IMPLANT
PIN SAFETY STERILE (MISCELLANEOUS) IMPLANT
SLEEVE SCD COMPRESS KNEE MED (MISCELLANEOUS) ×2 IMPLANT
SPONGE GAUZE 4X4 12PLY (GAUZE/BANDAGES/DRESSINGS) IMPLANT
SPONGE INTESTINAL PEANUT (DISPOSABLE) IMPLANT
SPONGE LAP 18X18 X RAY DECT (DISPOSABLE) IMPLANT
SPONGE LAP 4X18 X RAY DECT (DISPOSABLE) ×2 IMPLANT
STAPLER VISISTAT 35W (STAPLE) ×2 IMPLANT
SUT ETHILON 2 0 FS 18 (SUTURE) IMPLANT
SUT ETHILON 3 0 FSL (SUTURE) IMPLANT
SUT MNCRL AB 4-0 PS2 18 (SUTURE) ×3 IMPLANT
SUT VIC AB 4-0 BRD 54 (SUTURE) IMPLANT
SUT VICRYL 3-0 CR8 SH (SUTURE) ×2 IMPLANT
SYR CONTROL 10ML LL (SYRINGE) ×4 IMPLANT
TOWEL OR 17X24 6PK STRL BLUE (TOWEL DISPOSABLE) ×2 IMPLANT
TOWEL OR NON WOVEN STRL DISP B (DISPOSABLE) ×2 IMPLANT
TUBE CONNECTING 20X1/4 (TUBING) ×2 IMPLANT
WATER STERILE IRR 1000ML POUR (IV SOLUTION) ×2 IMPLANT
YANKAUER SUCT BULB TIP NO VENT (SUCTIONS) ×2 IMPLANT

## 2011-05-25 NOTE — Transfer of Care (Signed)
Immediate Anesthesia Transfer of Care Note  Patient: Chloe Watkins  Procedure(s) Performed:  BREAST LUMPECTOMY WITH SENTINEL LYMPH NODE BX - left breast needle localization  lumpectomy and sentinel lymph node   Patient Location: Patient transported to PACU with oxygen via face mask at 6 Liters / Min  Anesthesia Type: General  Level of Consciousness: awake and alert   Airway & Oxygen Therapy: Patient Spontanous Breathing and Patient connected to face mask oxygen Post-op Assessment: Report given to PACU RN and Post -op Vital signs reviewed and stable  Post vital signs: Reviewed and stable  Complications: No apparent anesthesia complications  Filed Vitals:   05/25/11 0945  BP: 173/95  Pulse:   Temp:

## 2011-05-25 NOTE — Anesthesia Preprocedure Evaluation (Signed)
Anesthesia Evaluation  Patient identified by MRN, date of birth, ID band Patient awake    Reviewed: Allergy & Precautions, H&P , NPO status , Patient's Chart, lab work & pertinent test results, reviewed documented beta blocker date and time   Airway Mallampati: II TM Distance: >3 FB Neck ROM: full    Dental   Pulmonary neg pulmonary ROS,          Cardiovascular hypertension, On Medications and On Home Beta Blockers     Neuro/Psych  Headaches, Negative Psych ROS   GI/Hepatic negative GI ROS, Neg liver ROS,   Endo/Other  Negative Endocrine ROS  Renal/GU negative Renal ROS  Genitourinary negative   Musculoskeletal   Abdominal   Peds  Hematology negative hematology ROS (+)   Anesthesia Other Findings See surgeon's H&P   Reproductive/Obstetrics negative OB ROS                           Anesthesia Physical Anesthesia Plan  ASA: II  Anesthesia Plan: General   Post-op Pain Management:    Induction: Intravenous  Airway Management Planned: LMA  Additional Equipment:   Intra-op Plan:   Post-operative Plan: Extubation in OR  Informed Consent: I have reviewed the patients History and Physical, chart, labs and discussed the procedure including the risks, benefits and alternatives for the proposed anesthesia with the patient or authorized representative who has indicated his/her understanding and acceptance.     Plan Discussed with: CRNA and Surgeon  Anesthesia Plan Comments:         Anesthesia Quick Evaluation

## 2011-05-25 NOTE — Anesthesia Postprocedure Evaluation (Signed)
Anesthesia Post Note  Patient: Chloe Watkins  Procedure(s) Performed:  BREAST LUMPECTOMY WITH SENTINEL LYMPH NODE BX - left breast needle localization  lumpectomy and sentinel lymph node   Anesthesia type: General  Patient location: PACU  Post pain: Pain level controlled  Post assessment: Patient's Cardiovascular Status Stable  Last Vitals:  Filed Vitals:   05/25/11 1400  BP: 185/97  Pulse: 67  Temp: 36.7 C  Resp: 16    Post vital signs: Reviewed and stable  Level of consciousness: alert  Complications: No apparent anesthesia complications

## 2011-05-25 NOTE — Anesthesia Procedure Notes (Signed)
Procedure Name: Intubation Performed by: Lorrin Jackson Pre-anesthesia Checklist: Patient identified, Emergency Drugs available, Suction available and Patient being monitored Patient Re-evaluated:Patient Re-evaluated prior to inductionOxygen Delivery Method: Circle System Utilized Preoxygenation: Pre-oxygenation with 100% oxygen Intubation Type: IV induction, Rapid sequence and Cricoid Pressure applied Grade View: Grade II Tube type: Oral Tube size: 7.0 mm Number of attempts: 1 Airway Equipment and Method: stylet and oral airway Placement Confirmation: ETT inserted through vocal cords under direct vision,  positive ETCO2 and breath sounds checked- equal and bilateral Tube secured with: Tape Dental Injury: Teeth and Oropharynx as per pre-operative assessment

## 2011-05-25 NOTE — Op Note (Signed)
Chloe Watkins  January 25, 1936  [id number]  05/25/2011   Preoperative diagnosis: Clinical stage I left breast cancer upper-outer quadrant  Postoperative diagnosis: Same  Procedure: Needle localized lumpectomy with blue dye injection and axillary sentinel lymph node dissection  Surgeon: [name], MD, FACS  Anesthesia: General  Clinical History and Indications: This patient was recently seen with a newly diagnosed left breast cancer which appear to be a clinical stage I. After discussion of alternatives she elected to proceed to lumpectomy with sentinel node evaluation.  Description of Procedure:The patient was seen in the preoperative area and the localizing films reviewed and the patient examined. The left breast as marked as the operative side.  The patient was taken to the operating room and after satisfactory general anesthesia had been obtained a time out was done. The left breast was then injected with 5 cc of dilute methylene blue which was massaged in. A full prep and drape was done. The guidewire entered laterally and tracked medially and I could palpate what I thought was a mass along the guidewire tract. I made a curvilinear incision directly over the mass. I elevated skin flaps in all directions and manipulate the guidewire into the wound. I then excised the mass going around in all directions. The closest margin appeared to be posterior but tissue moved over it and I took the fascia from the muscle. The specimen mammogram showed the mass to be contained in the specimen.  I irrigated and made sure everything was dry. The clips in to mark the margins.I elevated the breast tissue from the muscle and closed the deeper layers with 3-0 Vicryl, then the more superficial layers with 3-0 Vicryl, and the skin with 4-0 Monocryl subcuticular and Dermabond. Prior to closing I irrigated to make sure everything was dry and injected about 20 cc of 0.25% plain Marcaine.  Attention was turned  to the axilla. The neoprobe was used to identify a hot area and a transverse incision made. It was deepened until I saw the axillary fat pad.There is some high area somewhat posterior and I grasped this tissue with the Babcock and excised it. I thought there might be a tiny node in it. I did not see any dye.  Somewhat higher up or more counts I was able to dissected this area and find a hot blue node which was totally excised. It had counts of about 500. With that out your background counts of only 0-5 and no palpable abnormality. I then closed with 3-0 Vicryl, 4-0 Monocryl subcuticular, and Dermabond.  The patient tolerated the procedure well. There were no operative complications. All counts were correct.   EBL: Minimal  [name], MD, FACS 05/25/2011 12:30 PM

## 2011-05-26 ENCOUNTER — Telehealth (INDEPENDENT_AMBULATORY_CARE_PROVIDER_SITE_OTHER): Payer: Self-pay

## 2011-05-26 MED ORDER — HYDROCODONE-ACETAMINOPHEN 5-325 MG PO TABS: 1.0000 | ORAL_TABLET | Freq: Four times a day (QID) | ORAL | Status: AC | PRN

## 2011-05-26 MED ORDER — MORPHINE SULFATE 2 MG/ML IJ SOLN: 2.0000 mg | INTRAMUSCULAR | Status: DC | PRN

## 2011-05-26 NOTE — Progress Notes (Signed)
1 Day Post-Op  Subjective: Feels well and able to go home. Minimal pain  Objective: Vital signs in last 24 hours: Temp:  [97.6 F (36.4 C)-98.2 F (36.8 C)] 98 F (36.7 C) (01/18 0539) Pulse Rate:  [60-72] 71  (01/18 0539) Resp:  [9-18] 16  (01/18 0539) BP: (126-185)/(79-98) 126/83 mmHg (01/18 0539) SpO2:  [92 %-100 %] 97 % (01/18 0539)    Intake/Output from previous day: 01/17 0701 - 01/18 0700 In: 3162.5 [P.O.:1640; I.V.:1522.5] Out: -  Intake/Output this shift:    General appearance: alert and no distress Dressings dry left on.  Lab Results:  No results found for this basename: WBC:2,HGB:2,HCT:2,PLT:2 in the last 72 hours BMET No results found for this basename: NA:2,K:2,CL:2,CO2:2,GLUCOSE:2,BUN:2,CREATININE:2,CALCIUM:2 in the last 72 hours PT/INR No results found for this basename: LABPROT:2,INR:2 in the last 72 hours ABG No results found for this basename: PHART:2,PCO2:2,PO2:2,HCO3:2 in the last 72 hours  Studies/Results: Nm Sentinel Node Inj-no Rpt (breast)  05/25/2011  CLINICAL DATA: breast cancer   Sulfur colloid was injected intradermally by the nuclear medicine  technologist for breast cancer sentinel node localization.     Mm Breast Surgical Specimen  05/25/2011  *RADIOLOGY REPORT*  Clinical Data:  Preoperative needle localization for left upper outer quadrant biopsy-proven invasive ductal carcinoma  NEEDLE LOCALIZATION WITH MAMMOGRAPHIC GUIDANCE AND SPECIMEN RADIOGRAPH  Patient presents for needle localization prior to lumpectomy.  I met with the patient and we discussed the procedure of needle localization including benefits and alternatives. We discussed the high likelihood of a successful procedure. We discussed the risks of the procedure, including infection, bleeding, tissue injury, and further surgery. Informed, written consent was given.  Using mammographic guidance, sterile technique, 2% lidocaine and a 7 cm modified Kopans needle, the mass and clip were  localized using a lateral to medial approach.  The films are marked for Dr. Jamey Ripa.  Specimen radiograph was performed at [facility], and confirms the intact hook wire, clip, and mass are present in the tissue sample. The specimen is marked for pathology.  IMPRESSION: Needle localization left breast.  No apparent complications.  Original Report Authenticated By: Harrel Lemon, M.D.   Mm Breast Wire Localization Left  05/25/2011  *RADIOLOGY REPORT*  Clinical Data:  Preoperative needle localization for left upper outer quadrant biopsy-proven invasive ductal carcinoma  NEEDLE LOCALIZATION WITH MAMMOGRAPHIC GUIDANCE AND SPECIMEN RADIOGRAPH  Patient presents for needle localization prior to lumpectomy.  I met with the patient and we discussed the procedure of needle localization including benefits and alternatives. We discussed the high likelihood of a successful procedure. We discussed the risks of the procedure, including infection, bleeding, tissue injury, and further surgery. Informed, written consent was given.  Using mammographic guidance, sterile technique, 2% lidocaine and a 7 cm modified Kopans needle, the mass and clip were localized using a lateral to medial approach.  The films are marked for Dr. Jamey Ripa.  Specimen radiograph was performed at [facility], and confirms the intact hook wire, clip, and mass are present in the tissue sample. The specimen is marked for pathology.  IMPRESSION: Needle localization left breast.  No apparent complications.  Original Report Authenticated By: Harrel Lemon, M.D.    Anti-infectives: Anti-infectives     Start     Dose/Rate Route Frequency Ordered Stop   05/25/11 0900   ciprofloxacin (CIPRO) IVPB 400 mg        400 mg 200 mL/hr over 60 Minutes Intravenous 120 min pre-op 05/25/11 0858 05/25/11 1019  Assessment/Plan: s/p Procedure(s): BREAST LUMPECTOMY WITH SENTINEL LYMPH NODE BX Discharge  LOS: 1 day    Chloe Watkins  J 05/26/2011

## 2011-05-26 NOTE — Telephone Encounter (Signed)
Pt states she took off her wrap s/p breast surgery and the left breast is red, swollen and itching.  She is allergic to Penicillin and Latex causes her to itch.  She has no fever and no drainage.  I spoke to Dr Daphine Deutscher and he advised to take off the wrap and leave it off.  Use only cotton bras and t-shirts.  I advised the pt and asked her to call if no better.

## 2011-05-27 ENCOUNTER — Encounter (INDEPENDENT_AMBULATORY_CARE_PROVIDER_SITE_OTHER): Payer: Self-pay | Admitting: Surgery

## 2011-05-29 ENCOUNTER — Other Ambulatory Visit (INDEPENDENT_AMBULATORY_CARE_PROVIDER_SITE_OTHER): Payer: Self-pay | Admitting: Surgery

## 2011-05-29 ENCOUNTER — Telehealth (INDEPENDENT_AMBULATORY_CARE_PROVIDER_SITE_OTHER): Payer: Self-pay | Admitting: Surgery

## 2011-05-29 ENCOUNTER — Telehealth (INDEPENDENT_AMBULATORY_CARE_PROVIDER_SITE_OTHER): Payer: Self-pay | Admitting: General Surgery

## 2011-05-29 DIAGNOSIS — C50919 Malignant neoplasm of unspecified site of unspecified female breast: Secondary | ICD-10-CM

## 2011-05-29 NOTE — Telephone Encounter (Signed)
Message copied by Liliana Cline on Mon May 29, 2011  5:27 PM ------      Message from: [name]      Created: Mon May 29, 2011  3:15 PM       Told her path and will put in oncology visit. Give her a call for a post of visit with me.

## 2011-05-29 NOTE — Telephone Encounter (Signed)
Appt made with patient.  

## 2011-05-29 NOTE — Telephone Encounter (Signed)
Called her about path report and explained it. We will make arrangements for oncology appointment

## 2011-05-30 ENCOUNTER — Telehealth: Payer: Self-pay | Admitting: *Deleted

## 2011-05-30 NOTE — Telephone Encounter (Signed)
Confirmed 06/05/11 appt w/ pt.  Mailed before appt letter to pt.  Emailed Clydie Braun in [employer] for appt.

## 2011-06-02 ENCOUNTER — Encounter: Payer: Self-pay | Admitting: *Deleted

## 2011-06-05 ENCOUNTER — Ambulatory Visit: Payer: Medicare Other

## 2011-06-05 ENCOUNTER — Other Ambulatory Visit: Payer: Medicare Other | Admitting: Lab

## 2011-06-05 ENCOUNTER — Ambulatory Visit (HOSPITAL_BASED_OUTPATIENT_CLINIC_OR_DEPARTMENT_OTHER): Payer: Medicare Other | Admitting: Oncology

## 2011-06-05 ENCOUNTER — Ambulatory Visit
Admission: RE | Admit: 2011-06-05 | Discharge: 2011-06-05 | Disposition: A | Discharge status: Home / Self Care | Payer: Medicare Other | Source: Ambulatory Visit | Attending: Radiation Oncology | Admitting: Radiation Oncology

## 2011-06-05 ENCOUNTER — Other Ambulatory Visit: Payer: Self-pay | Admitting: *Deleted

## 2011-06-05 ENCOUNTER — Ambulatory Visit (HOSPITAL_BASED_OUTPATIENT_CLINIC_OR_DEPARTMENT_OTHER): Payer: Medicare Other

## 2011-06-05 ENCOUNTER — Ambulatory Visit: Payer: Medicare Other | Admitting: Radiation Oncology

## 2011-06-05 ENCOUNTER — Encounter: Payer: Self-pay | Admitting: Radiation Oncology

## 2011-06-05 VITALS — BP 111/73 | HR 69 | Temp 98.4°F | Ht 66.0 in | Wt 164.8 lb

## 2011-06-05 VITALS — BP 153/84 | HR 71 | Temp 97.7°F | Ht 65.0 in | Wt 164.2 lb

## 2011-06-05 DIAGNOSIS — C50919 Malignant neoplasm of unspecified site of unspecified female breast: Secondary | ICD-10-CM

## 2011-06-05 DIAGNOSIS — M810 Age-related osteoporosis without current pathological fracture: Secondary | ICD-10-CM | POA: Insufficient documentation

## 2011-06-05 DIAGNOSIS — Z510 Encounter for antineoplastic radiation therapy: Secondary | ICD-10-CM | POA: Insufficient documentation

## 2011-06-05 DIAGNOSIS — C50419 Malignant neoplasm of upper-outer quadrant of unspecified female breast: Secondary | ICD-10-CM

## 2011-06-05 DIAGNOSIS — Z806 Family history of leukemia: Secondary | ICD-10-CM | POA: Insufficient documentation

## 2011-06-05 DIAGNOSIS — Z79899 Other long term (current) drug therapy: Secondary | ICD-10-CM | POA: Insufficient documentation

## 2011-06-05 DIAGNOSIS — R220 Localized swelling, mass and lump, head: Secondary | ICD-10-CM | POA: Insufficient documentation

## 2011-06-05 DIAGNOSIS — Z87891 Personal history of nicotine dependence: Secondary | ICD-10-CM | POA: Insufficient documentation

## 2011-06-05 DIAGNOSIS — R221 Localized swelling, mass and lump, neck: Secondary | ICD-10-CM | POA: Insufficient documentation

## 2011-06-05 DIAGNOSIS — I10 Essential (primary) hypertension: Secondary | ICD-10-CM | POA: Insufficient documentation

## 2011-06-05 DIAGNOSIS — R42 Dizziness and giddiness: Secondary | ICD-10-CM | POA: Insufficient documentation

## 2011-06-05 LAB — COMPREHENSIVE METABOLIC PANEL
AST: 22 U/L (ref 0–37)
Albumin: 4.1 g/dL (ref 3.5–5.2)
Alkaline Phosphatase: 87 U/L (ref 39–117)
BUN: 12 mg/dL (ref 6–23)
Creatinine, Ser: 0.87 mg/dL (ref 0.50–1.10)
Glucose, Bld: 99 mg/dL (ref 70–99)
Total Bilirubin: 0.4 mg/dL (ref 0.3–1.2)

## 2011-06-05 LAB — CBC WITH DIFFERENTIAL/PLATELET
Basophils Absolute: 0 10*3/uL (ref 0.0–0.1)
EOS%: 2.9 % (ref 0.0–7.0)
Eosinophils Absolute: 0.2 10*3/uL (ref 0.0–0.5)
HGB: 14.5 g/dL (ref 11.6–15.9)
LYMPH%: 27.3 % (ref 14.0–49.7)
MCH: 30 pg (ref 25.1–34.0)
MCV: 91.5 fL (ref 79.5–101.0)
MONO%: 9.2 % (ref 0.0–14.0)
NEUT#: 5 10*3/uL (ref 1.5–6.5)
Platelets: 212 10*3/uL (ref 145–400)

## 2011-06-05 NOTE — Progress Notes (Signed)
CC:   [name], M.D. Lowella Dell, M.D. [name], M.D.  REFERRING PHYSICIAN:  [name], M.D.  DIAGNOSIS:  Stage II, poorly differentiated invasive ductal carcinoma of the left breast, pT2, pN1 mic (sn).  HISTORY OF PRESENT ILLNESS:  Chloe Watkins is a very pleasant, 76 year old female who is seen out courtesy of Dr. Jamey Ripa for an opinion concerning radiation therapy as part of the management of the patient's recently diagnosed left breast cancer.  Chloe Watkins, last year on examination by Dr. Nicholas Lose, was noted to have a palpable mass within the upper outer quadrant of the left breast.  According to the patient, she had undergone a routine screening mammogram in the spring of the same year showing no apparent abnormalities.  In light of  the palpable mass, the patient proceeded to undergo workup of this issue.  A digital diagnostic left mammogram and ultrasound were performed which revealed a round, high density mass which was partially irregular and partially circumscribed margins in the upper-outer quadrant left breast, posteriorly.  On ultrasound, this measured 1.5 x 1.2 x 1.5 cm and was located in the 2 o'clock position, approximately 7 cm from the nipple area.  The patient proceeded to undergo a needle core biopsy of this area with invasive ductal carcinoma recovered as well as in situ disease.  The patient was subsequently seen by Dr. Jamey Ripa, who palpated a  mass within the upper outer quadrant of the left breast.  At a later date, the patient underwent an MRI of the breast, which revealed a solitary, 1.6 x 2.1 x 2.1 cm mass in the upper outer quadrant of the left breast. There was no other disease noted within either breast or within the lymph nodal areas.  The patient was interested in breast conservation therapy and on 05/25/2011, she was taken to the operating room at which time the patient underwent partial mastectomy and sentinel node  procedure.  Upon pathologic review, the patient was found have invasive ductal carcinoma , grade 3, measuring 2.2 cm in size.  There was some associated ductal carcinoma in situ with associated comedonecrosis.  The surgical margins were clear with the closest margin being anterior and superior at 0.3 cm.  The in situ margins were close anteriorly at 0.1 cm.  There was lymphovascular space invasion noted.  The tumor was estrogen receptor- positive at 100% and progesterone receptor-positive at 97%.  There was no HER-2/neu amplification.  The Ki-67 was mildly elevated at 27%. Within the patient's sentinel node specimen, 1 lymph node showed micrometastasis.  The 2nd lymph node was negative for malignancy.  With the above findings, the patient is now seen in Radiation Oncology for consultation and consideration for treatment.  PAST MEDICAL HISTORY:  The patient has allergy to penicillins.  Medical history is significant for history of anemia, anxiety, clotting disorder, hypertension and osteoporosis, and history of thyroid disease.  CURRENT MEDICATIONS:  Norco 5/325 as needed for pain.  The patient is also on Tenormin 50 mg daily.  PRIOR SURGERIES: 1. Vein ligation and stripping. 2. Uterine fibroid surgery. 3. Ankle surgery. 4. Wrist surgery. 5. Eye surgery (left cataract). 6. Prior cholecystectomy.  SOCIAL HISTORY:  The patient is retired and lives in the [place] area.  The patient has a remote history of tobacco use.  There is no history of alcohol intake.  FAMILY HISTORY:  No family history of breast or ovarian cancer.  There is a family history of leukemia.  REVIEW OF SYSTEMS:  The patient denied any pain within the breast area, nipple discharge, or bleeding prior to diagnosis.  Postoperatively, the patient has some mild discomfort in the breast area.  She has noticed some numbness along the upper portion of her left arm.  The patient denies any obvious swelling issues in  her left arm since surgery.  The patient denies any new bony pain, headaches, dizziness, or blurred vision.  A complete review of systems is undertaken with the patient and placed in the electronic medical record and reviewed by myself.  PHYSICAL EXAMINATION:  General Appearance:  This is a very pleasant, 55- year-old female in no acute distress.  Vital Signs:  Temperature 97.7. Pulse 71.  Respirations 20.  Blood pressure is 153/84.  Height is 5 feet and 5 inches.  Weight is 164 pounds.  HEENT:  Examination of the pupils reveals them to be equal, round, and reactive to light.  The extraocular eye movements are intact.  The tongue is midline.  There is no secondary infection noted in the oral cavity or posterior pharynx.  Neck: Examination of the neck and supraclavicular region reveals no evidence of adenopathy.  Axillae:  The axillary areas are free of adenopathy. Lungs:  Examination of the lungs reveals them to be clear.  Heart: Regular rhythm and rate.  Breasts:  Examination of the right breast reveals no mass or nipple discharge.  Examination of the left breast reveals a scar in the upper outer quadrant, which is healing well without signs of drainage or infection.  The patient has a separate, smaller scar in the axillary region from her sentinel node procedure. There is no dominant mass appreciated in the breast, nipple discharge, or bleeding.  There is some bruising noted in the breast.  Abdomen: Examination the abdomen reveals it to be soft and nontender with normal bowel sounds.  There is no obvious hepatosplenomegaly.  Neurological Examination:  Motor strength is 5/5 in the proximal and distal muscle groups of the upper and lower extremities.  Peripheral pulses are good. There is no appreciable cyanosis, clubbing, or edema.  LABORATORY DATA:  Laboratory data from 05/25/2011:  White count 8.6, hemoglobin 14.4, hematocrit 44.5, platelet count 227,000.  BUN 11, creatinine 0.94.  CA  27.29 is 22.  X-RAY STUDIES:  As summarized in the HPI.  IMPRESSION AND PLAN:  Stage II, poorly differentiated, invasive ductal carcinoma of the left breast.  I do feel Chloe Watkins would be a good candidate for breast conservation with radiation therapy directed at the left breast.  Given the size of the patient's tumor as well as the aggressive nature and close margins as well as micrometastasis to the axillary region, I would not recommend excisional biopsy alone in this situation.  I would recommend consideration for coverage of axilla given the patient's micrometastasis and would, at least, cover the level 1, if not, most of the level 2 nodes with tangential beams if the patient elects not to have full coverage of the axilla.  The patient will be meeting with Dr. Darnelle Catalan later today and final details concerning the patient's overall management are pending his recommendation.  If adjuvant chemotherapy is not recommended for Mrs. Zartman, then she will be seen back in the near future for planning for her radiation treatments.  The patient does live in the [place] area and is considering treatments at the [facility] in [place].   ______________________________ Billie Lade, Ph.D., M.D. JDK/MEDQ  D:  06/05/2011  T:  06/05/2011  Job:  [id number]

## 2011-06-05 NOTE — Progress Notes (Signed)
Here for consultation for breast cancer. Menses age 76 Age first pregnancy 18 2 pregnancies vaginal births. Took HRT for approx. 8 months. ERPR +

## 2011-06-05 NOTE — Progress Notes (Signed)
Please see the Nurse Progress Note in the MD Initial Consult Encounter for this patient. 

## 2011-06-06 ENCOUNTER — Ambulatory Visit (INDEPENDENT_AMBULATORY_CARE_PROVIDER_SITE_OTHER): Payer: Medicare Other | Admitting: Surgery

## 2011-06-06 ENCOUNTER — Encounter (INDEPENDENT_AMBULATORY_CARE_PROVIDER_SITE_OTHER): Payer: Self-pay | Admitting: Surgery

## 2011-06-06 VITALS — BP 126/82 | HR 70 | Temp 97.8°F | Resp 18 | Ht 65.25 in | Wt 164.0 lb

## 2011-06-06 DIAGNOSIS — C50919 Malignant neoplasm of unspecified site of unspecified female breast: Secondary | ICD-10-CM

## 2011-06-06 MED ORDER — HYDROCODONE-ACETAMINOPHEN 5-325 MG PO TABS: 1.0000 | ORAL_TABLET | ORAL | Status: AC | PRN

## 2011-06-06 NOTE — Patient Instructions (Signed)
See me again when you have finished your radiation therapy

## 2011-06-06 NOTE — Progress Notes (Signed)
Chloe Watkins  DOB: [birth year]  MR#: [id number]  CSN#: [id number]    History of present illness:    The patient is a 76 year old [place] woman who on 07/28/2010 [facility]. In November 2012 however, when she saw for gynecologist, Chloe Watkins, she noted a mass in her left breast and set her up for left mammography and ultrasonography, performed 04/12/2011. This showed a high density mass with irregular margins in the upper outer quadrant of the left breast which was palpable and mobile. The axilla was unremarkable palpation and by ultrasonography. Ultrasound confirmed a round hypoechoic mass measuring 1.5 cm.  Biopsy of this mass was performed 04/21/2011, and showed [phone]) an invasive ductal carcinoma, high-grade, estrogen receptor 100% positive, progesterone receptor 97% positive, with an MIB-1 of 27%, and no HER-2 amplification.  The patient then had a right diagnostic mammogram and 05/05/2011, which was unremarkable. Bilateral breast MRIs in 05/15/2011 showed a 2.1 cm enhancing mass in the upper outer left breast, with no other abnormal areas in either breast and no abnormal lymph nodes, bony or upper hepatic abnormalities noted.  On 05/24/2010, the patient underwent left lumpectomy and sentinel lymph node sampling under [facility]. This showed ([id number]) and invasive ductal carcinoma, grade 3, measuring 2.2 cm, with one of 2 sentinel lymph nodes involved by micrometastatic deposit. Margins were negative. Repeat HER-2 assessment was begin negative.  Past medical history:      Past Medical History  Diagnosis Date  . Anemia secondary to menorrhagia--remote, resolved   . Anxiety   . Clotting disorder   . Hypertension   . Osteoporosis   . Thyroid disease   . Chills   . Hearing loss   . Leg swelling   . Diarrhea   . Osteoarthritis   . Headache   . Bruises easily   . Breast cancer 04/21/2011    INVASIVE DUCTAL CA  . S/p multiple  fractures, with metal plates in Left foot, Right wrist         Past surgical history:      Past Surgical History  Procedure Date  . Vein ligation and stripping 1960  . Uterine fibroid surgery 1975  . Ankle surgery 2000  . Wrist surgery 2000    right  . Eye surgery     lt cataract  . Breast lumpectomy 05/25/11    L breast, node bx: inv ductal, dcis, 1/2 nodes pos, ER/PR +, HER 2 -  . Cholecystectomy 1970   s/p- APPY  Family history:    The patient's father died in his 80s from heart disease. The patient's mother died following a stroke at the age of 7. The patient had 4 sisters, one brother. The brother died from kidney cancer. One sister died 2 weeks ago from complications of diabetes. One sister died from melanoma at the age of 55. The 2 other sisters died secondary to strokes. There is no history of breast or ovarian cancer in the family.  Gynecologic history: GX P2, first pregnancy to term age 11. She had menopause age 76. She took hormones very briefly, and was intolerant (because of hypertension).    Social history:   The patient used to work for [employer], but became disabled secondary to her multiple back injuries. She is divorced and lives by herself. Her son Chloe Watkins lives in [place] where he works at a Manufacturing systems engineer. Her daughter Chloe Watkins lives in [place], a few miles from  the patient. She is a Futures trader. The patient has 1 grandchild and 2 great-grandchildren. She is not a church attendant her.    ADVANCED DIRECTIVES: Not in place  Health maintenance:       History  Substance Use Topics  . Smoking status: Former Smoker    Quit date: 05/07/1981  . Smokeless tobacco: Not on file  . Alcohol Use: No      Colonoscopy: Never  PAP: Up-to-date  Bone density: Obtained 2012, showing osteoporosis.  Cholesterol: "Good"  Review of systems:  She tolerated her surgery without any unusual complications, in particular no unusual fever, swelling, dehiscence,  bleeding, or rash. She has some insomnia, some problems with her dentures, and of course her back pain and arthritis. Otherwise a detailed review of systems was entirely noncontributory  Allergies:     Allergies  Allergen Reactions  . Penicillins Itching and Swelling    Medications:      Current Outpatient Prescriptions  Medication Sig Dispense Refill  . atenolol (TENORMIN) 50 MG tablet Take 50 mg by mouth daily.        Marland Kitchen HYDROcodone-acetaminophen (NORCO) 5-325 MG per tablet Take 1 tablet by mouth every 6 (six) hours as needed for pain.  30 tablet  0  . MULTIPLE VITAMINS PO Take 1 tablet by mouth.        Physical exam:  Elderly white female who appears anxious    Filed Vitals:   06/05/11 1618  BP: 153/84  Pulse: 71  Temp: 97.7 F (36.5 C)     Body mass index is 27.32 kg/(m^2).  ECOG PS: 1  Sclerae unicteric Oropharynx clear No peripheral adenopathy Lungs no rales or rhonchi Heart regular rate and rhythm Abd benign MSK no focal spinal tenderness, no peripheral edema Neuro: nonfocal Breasts: Right breast no suspicious masses. Left breast status post recent lumpectomy and accelerate sentinel lymph node biopsy. Both incisions are healing well, without dehiscence, swelling, or erythema   Lab results:      CBC  Lab 06/05/11 1607  WBC 8.2  HGB 14.5  HCT 44.3  PLT 212  MCV 91.5  MCH 30.0  MCHC 32.8  RDW 14.0  LYMPHSABS 2.2  MONOABS 0.8  EOSABS 0.2  BASOSABS 0.0  BANDABS --    Chemistries   Lab 06/05/11 1607  NA 136  K 4.0  CL 100  CO2 27  GLUCOSE 99  BUN 12  CREATININE 0.87  CALCIUM 9.6  MG --       Studies:      Chest 2 View  05/22/2011  *RADIOLOGY REPORT*  Clinical Data: Preop breast surgery.  CHEST - 2 VIEW  Comparison: None.  Findings: Trachea is midline.  Heart size normal.  Scattered linear densities at the lung bases are likely due to scarring.  Lungs are otherwise clear.  No pleural fluid.  Lower thoracic spine is kyphotic.  IMPRESSION:  Scattered linear densities at the lung bases are likely due to scarring.  No acute findings.  Original Report Authenticated By: Reyes Ivan, M.D.    Assessment: 76 year old [place] woman status post left lumpectomy and sentinel lymph node sampling 05/25/2011 for a T2 N1 (mic), Stage 2A invasive ductal carcinoma, grade 3, strongly estrogen and progesterone receptor positive, HER-2 negative, with an MIB-1 of 27%.  Plan: we spent the better part of her hour-long visit discussing the biology of her tumor so she would understand the basis of her prognosis. The adjuvant! Program would "her a risk of recurrence between  40 and 60% with local treatment only depending on whether or we consider the microscopic lymph node involvement as positive or negative. We used a figure of 45% for the purpose of discussion, and I quoted her a 20% risk reduction with anti-estrogens alone, leaving her with a one out of 4 risk of recurrence within the next 10 years. Chemotherapy might drop that risk by somewhere between 5 and 10%, and I deliberately aged that percentage of to help clarify the patient's decision making--namely if she would not want that chemotherapy even for a possible 10% risk reduction, then clearly as she doesn't wanted for a lesser benefits.  The patient tells me she had at many relatives who received chemotherapy "and died". She also describes herself as too old to receive chemotherapy. Despite this she was unable to reach a decision today. The NCCN guidelines recommend an Oncotype in cases like this, and if the patient is unable to reach a clear decision we will obtain that test. We left it that we will talk again later this week. The feeling I got was that the patient really did not want chemotherapy but wanted a little bit more time to think about it.  Incidentally the patient is not in close contact with her son in [place], whom she says she has not seen in 2 years. She is closer to her daughter who  lives in this area, but she says her daughter does not like to talk about medical problems much less cancer. The patient is very private and does not like the idea of her medical problems being batted about by friends and neighbors. I am concerned that she is somewhat isolated as she faces these decisions, and I offered to meet with her again, or to participate in our support groups. I think it will be important for her to clarify her advanced directives in any case, and we will discuss that at the next visit here, which tentatively (on the expectation that she will not receive chemotherapy) I will be 2 months from now    [facility] 06/06/2011

## 2011-06-06 NOTE — Progress Notes (Signed)
Chloe Watkins    [id number] 06/06/2011    February 15, 1936   CC: Post op lumpectomy and SLN  HPI: The patient returns for post op follow-up. She underwent asurgery on 05/25/2011. Over all she feels that she is doing well. She is having some pain in the axillary incision  PE: The incision is healing nicely and there is no evidence of infection or hematoma.  DATA REVIEWED: Pathology report showed IDC with a micromet in 1/2 sln  IMPRESSION: Patient doing well. No apparent problems  PLAN: Her next visit will be in about three months when she finishes radiation.She is trying to decide about chemo, but leaning against it

## 2011-06-06 NOTE — Progress Notes (Signed)
Encounter addended by: Tessa Lerner, RN on: 06/06/2011  2:05 PM<BR>     Documentation filed: Charges VN

## 2011-06-07 ENCOUNTER — Encounter: Payer: Self-pay | Admitting: *Deleted

## 2011-06-07 NOTE — Progress Notes (Signed)
Mailed after appt letter to pt. 

## 2011-06-14 ENCOUNTER — Ambulatory Visit
Admission: RE | Admit: 2011-06-14 | Discharge: 2011-06-14 | Disposition: A | Discharge status: Home / Self Care | Payer: Medicare Other | Source: Ambulatory Visit | Attending: Radiation Oncology | Admitting: Radiation Oncology

## 2011-06-14 DIAGNOSIS — C50919 Malignant neoplasm of unspecified site of unspecified female breast: Secondary | ICD-10-CM

## 2011-06-14 NOTE — Progress Notes (Signed)
Met with patient to discuss RO billing.  Patient had no concerns today. 

## 2011-06-15 NOTE — Progress Notes (Signed)
DIAGNOSIS:  Left breast cancer.  NARRATIVE:  Earlier today, Mrs. Cormany underwent treatment planning to begin radiation therapy directed at the left breast, axillary and supraclavicular region.  The patient was placed on the CT simulator table.  A wire was placed along the lumpectomy scar in the upper-outer aspect of the breast.  The patient also had a custom AccuForm mold placed for positioning of the head and neck region.  The patient had wires outlining the breast tissue.  She then proceeded to undergo CT scan in the treatment position.  Under virtual simulation, the lumpectomy cavity, lumpectomy scar and nipple area were outlined.  The patient then had setup under virtual simulation, 2 tangential beams encompassing the left breast.  Forward planning will be used to improve the dose homogeneity.  Custom blocking was used to shield uninvolved areas of the chest.  One isocenter was used for the patient's setup. The patient then had setup of a right anterior oblique field encompassing the right supraclavicular region and axillary area.  This also had custom blocking.  The patient's final field was set up in a PA orientation encompassing the mid axilla.  Custom blocking was used on this field.  A computerized isodose plan will be generated for treatment for all four fields.  The patient will likely be treated with a combination of 6 and 18 MV photons.  TREATMENT PLAN:  The patient is to proceed with daily radiation treatments at 180 cGy per day for 25 treatments for a dose to the left breast, axillary and supraclavicular region of 4500 cGy.  The patient will then proceed with a boost directed at the site of presentation in the upper-outer aspect of the breast area and continue to a cumulative dose of 6300 cGy.    ______________________________ Billie Lade, Ph.D., M.D. JDK/MEDQ  D:  06/14/2011  T:  06/15/2011  Job:  [id number]

## 2011-06-21 ENCOUNTER — Ambulatory Visit
Admission: RE | Admit: 2011-06-21 | Discharge: 2011-06-21 | Disposition: A | Discharge status: Home / Self Care | Payer: Medicare Other | Source: Ambulatory Visit | Attending: Radiation Oncology | Admitting: Radiation Oncology

## 2011-06-22 ENCOUNTER — Ambulatory Visit
Admission: RE | Admit: 2011-06-22 | Discharge: 2011-06-22 | Disposition: A | Discharge status: Home / Self Care | Payer: Medicare Other | Source: Ambulatory Visit | Attending: Radiation Oncology | Admitting: Radiation Oncology

## 2011-06-22 ENCOUNTER — Ambulatory Visit: Payer: Medicare Other

## 2011-06-22 NOTE — Procedures (Signed)
DIAGNOSIS:  Left breast cancer.  NARRATIVE:  Earlier today, Chloe Watkins underwent film verification of her setup directed at the left breast and locoregional area.  The patient's isocenter was accurately placed, and the multileaf collimators contour the target volume accurately on all fields imaged.    ______________________________ Billie Lade, Ph.D., M.D. JDK/MEDQ  D:  06/21/2011  T:  06/22/2011  Job:  [id number]

## 2011-06-23 ENCOUNTER — Ambulatory Visit
Admission: RE | Admit: 2011-06-23 | Discharge: 2011-06-23 | Disposition: A | Discharge status: Home / Self Care | Payer: Medicare Other | Source: Ambulatory Visit | Attending: Radiation Oncology | Admitting: Radiation Oncology

## 2011-06-24 ENCOUNTER — Other Ambulatory Visit: Payer: Self-pay | Admitting: Oncology

## 2011-06-26 ENCOUNTER — Ambulatory Visit
Admission: RE | Admit: 2011-06-26 | Discharge: 2011-06-26 | Disposition: A | Discharge status: Home / Self Care | Payer: Medicare Other | Source: Ambulatory Visit | Attending: Radiation Oncology | Admitting: Radiation Oncology

## 2011-06-27 ENCOUNTER — Encounter: Payer: Self-pay | Admitting: *Deleted

## 2011-06-27 ENCOUNTER — Ambulatory Visit
Admission: RE | Admit: 2011-06-27 | Discharge: 2011-06-27 | Disposition: A | Discharge status: Home / Self Care | Payer: Medicare Other | Source: Ambulatory Visit | Attending: Radiation Oncology | Admitting: Radiation Oncology

## 2011-06-27 VITALS — BP 138/94 | HR 76 | Temp 98.1°F | Wt 163.5 lb

## 2011-06-27 DIAGNOSIS — C50419 Malignant neoplasm of upper-outer quadrant of unspecified female breast: Secondary | ICD-10-CM

## 2011-06-27 DIAGNOSIS — C50919 Malignant neoplasm of unspecified site of unspecified female breast: Secondary | ICD-10-CM

## 2011-06-27 MED ORDER — ALRA NON-METALLIC DEODORANT (RAD-ONC)
1.0000 "application " | Freq: Once | TOPICAL | Status: AC
  Administered 2011-06-27: 1 via TOPICAL

## 2011-06-27 MED ORDER — RADIAPLEXRX EX GEL
Freq: Once | CUTANEOUS | Status: AC
  Administered 2011-06-27: 1 via TOPICAL

## 2011-06-27 NOTE — Progress Notes (Signed)
Completed [employer] Dx request w/ [facility].  Faxed request to [facility].  Faxed PAC to [employer].

## 2011-06-27 NOTE — Progress Notes (Signed)
C/O HAVING PRESSURE IN CENTER OF CHEST.  ALSO SAID SHE WAS CONCERNED ABOUT NOT BEING ABLE TO WALK STRAIGHT WHEN GETTING OFF TX TABLE.

## 2011-06-28 ENCOUNTER — Ambulatory Visit
Admission: RE | Admit: 2011-06-28 | Discharge: 2011-06-28 | Disposition: A | Discharge status: Home / Self Care | Payer: Medicare Other | Source: Ambulatory Visit | Attending: Radiation Oncology | Admitting: Radiation Oncology

## 2011-06-28 NOTE — Progress Notes (Signed)
CC:   [name], M.D. Lowella Dell, M.D. [name], M.D.  DIAGNOSIS:  Left breast cancer.  NARRATIVE:  Ms. Chloe Watkins is seen today for weekly assessment.  She has completed 4 out of 34 planned treatments directed at the left breast and axillary region (720 cGy of a planned 6300 cGy).  The patient is tolerating her treatments well at this time without any itching or discomfort in the breast area or fatigue.  The patient has noticed some dizziness when getting up off the examination table on 2 occasions.  PHYSICAL EXAMINATION:  General:  The patient is alert and oriented. Vital Signs:  Her temperature is 98.1, blood pressure sitting is 160/99 and standing is 138/94.  Pulse is 76 standing and 69 sitting.  The patient does admit to poor p.o. intake and did not take her blood pressure medication earlier today.  Neurologic Examination:  There are no focal deficits noted.  HEENT:  The oral cavity is free of any infection.  Lungs:  Clear.  Heart:  Has a regular rhythm and rate. Breasts:  Examination of the left breast area reveals no appreciable reaction at this point.  The patient's lumpectomy scar is well healed.  IMPRESSION AND PLAN:  The patient is tolerating her radiation treatments well thus far.  The patient's radiation fields are setting up accurately.  The patient's radiation chart was checked today.  I did recommend the patient force fluids and make sure she takes her blood pressure medication on a regular basis.  If her problems with dizziness persist, I have also recommended she discuss this with her primary care physician.    ______________________________ Billie Lade, Ph.D., M.D. JDK/MEDQ  D:  06/27/2011  T:  06/27/2011  Job:  [id number]

## 2011-06-29 ENCOUNTER — Ambulatory Visit
Admission: RE | Admit: 2011-06-29 | Discharge: 2011-06-29 | Disposition: A | Discharge status: Home / Self Care | Payer: Medicare Other | Source: Ambulatory Visit | Attending: Radiation Oncology | Admitting: Radiation Oncology

## 2011-06-30 ENCOUNTER — Ambulatory Visit: Payer: Medicare Other

## 2011-07-03 ENCOUNTER — Ambulatory Visit
Admission: RE | Admit: 2011-07-03 | Discharge: 2011-07-03 | Disposition: A | Discharge status: Home / Self Care | Payer: Medicare Other | Source: Ambulatory Visit | Attending: Radiation Oncology | Admitting: Radiation Oncology

## 2011-07-04 ENCOUNTER — Ambulatory Visit
Admission: RE | Admit: 2011-07-04 | Discharge: 2011-07-04 | Disposition: A | Discharge status: Home / Self Care | Payer: Medicare Other | Source: Ambulatory Visit | Attending: Radiation Oncology | Admitting: Radiation Oncology

## 2011-07-04 ENCOUNTER — Encounter: Payer: Self-pay | Admitting: Radiation Oncology

## 2011-07-04 VITALS — BP 157/97 | HR 67 | Temp 97.9°F | Resp 20 | Wt 164.6 lb

## 2011-07-04 DIAGNOSIS — C50919 Malignant neoplasm of unspecified site of unspecified female breast: Secondary | ICD-10-CM

## 2011-07-04 NOTE — Progress Notes (Signed)
CC:   Gaspar Garbe, M.D.  DIAGNOSIS:  Left breast cancer.  NARRATIVE:  Mrs. Beam is seen today for weekly assessment.  She has completed 8/34 planned treatments directed at the left breast area (1,440 cGy of a planned 6,300 cGy).  The patient has noticed some soreness in the breast, but no actual itching.  She denies any significant fatigue at this time.  The patient continues to have some problems with dizziness when getting up off the treatment table.  The patient feels she is leaning towards the right.  In light of this, the therapist are always assisting the patient until the patient feels back to normal.  The patient did force fluids over the past week.  She denies any problems with dizziness with standing at home.  PHYSICAL EXAMINATION:  Vital Signs:  The patient's weight is 164.6 pounds .  Temperature is 97.9.  Pulse is 68 sitting.  Respirations are 20.  Blood pressure is 138/90.  With standing, the patient's pulse is 67, respirations 20, and blood pressure increases to 157/97. Examination of the lungs reveals them to be clear.  There is no palpable cervical or supraclavicular adenopathy.  I do not detect any carotid bruits on exam today.  The heart has a regular rhythm and rate. Examination the left breast reveals some mild erythema and hyperpigmentation changes.  IMPRESSION AND PLAN:  The patient is tolerating her treatments reasonably well, except for issues as above.  The patient's radiation fields are setting up accurately.  The patient's radiation chart was checked today.  In light of the patient issues with dizziness, I have recommended she speak with Dr. Wylene Simmer, her primary care physician, concerning this issue.  The patient does have a strong family history of cerebrovascular accidents.    ______________________________ Billie Lade, Ph.D., M.D. JDK/MEDQ  D:  07/04/2011  T:  07/04/2011  Job:  [id number]

## 2011-07-04 NOTE — Progress Notes (Signed)
Pt has completed 8/34 left breast treatments, pt gets dizzy when gets up off table from treatment, pt starts to walk and walks toward right, but when she gets dressed walks straight,off balance, pt has slight erythema on left breast area,looks like a stitch still at end of incision on breast,pt states"i drink 4 bottles water daily and sports drinks",took ortho vitals ,sitting  B/p=138/90,68, T=97.9, rr=20  Standing b/p=159/97, p=67, pt does get up and sit a minute before getting off the table, then stands and waits a moment before walking, pt states?breast just kinda aches all the time" 10:29 AM

## 2011-07-05 ENCOUNTER — Ambulatory Visit
Admission: RE | Admit: 2011-07-05 | Discharge: 2011-07-05 | Disposition: A | Discharge status: Home / Self Care | Payer: Medicare Other | Source: Ambulatory Visit | Attending: Radiation Oncology | Admitting: Radiation Oncology

## 2011-07-06 ENCOUNTER — Ambulatory Visit
Admission: RE | Admit: 2011-07-06 | Discharge: 2011-07-06 | Disposition: A | Discharge status: Home / Self Care | Payer: Medicare Other | Source: Ambulatory Visit | Attending: Radiation Oncology | Admitting: Radiation Oncology

## 2011-07-07 ENCOUNTER — Ambulatory Visit
Admission: RE | Admit: 2011-07-07 | Discharge: 2011-07-07 | Disposition: A | Discharge status: Home / Self Care | Payer: Medicare Other | Source: Ambulatory Visit | Attending: Radiation Oncology | Admitting: Radiation Oncology

## 2011-07-10 ENCOUNTER — Telehealth: Payer: Self-pay | Admitting: *Deleted

## 2011-07-10 ENCOUNTER — Encounter (INDEPENDENT_AMBULATORY_CARE_PROVIDER_SITE_OTHER): Payer: Self-pay | Admitting: Surgery

## 2011-07-10 ENCOUNTER — Ambulatory Visit
Admission: RE | Admit: 2011-07-10 | Discharge: 2011-07-10 | Disposition: A | Discharge status: Home / Self Care | Payer: Medicare Other | Source: Ambulatory Visit | Attending: Radiation Oncology | Admitting: Radiation Oncology

## 2011-07-10 NOTE — Telephone Encounter (Signed)
Left vm for pt to return call concerning Oncotype testing.

## 2011-07-11 ENCOUNTER — Ambulatory Visit
Admission: RE | Admit: 2011-07-11 | Discharge: 2011-07-11 | Disposition: A | Discharge status: Home / Self Care | Payer: Medicare Other | Source: Ambulatory Visit | Attending: Radiation Oncology | Admitting: Radiation Oncology

## 2011-07-11 ENCOUNTER — Encounter: Payer: Self-pay | Admitting: Radiation Oncology

## 2011-07-11 VITALS — BP 123/81 | HR 73 | Wt 165.0 lb

## 2011-07-11 DIAGNOSIS — C50919 Malignant neoplasm of unspecified site of unspecified female breast: Secondary | ICD-10-CM

## 2011-07-11 NOTE — Progress Notes (Signed)
DIAGNOSIS:  Left breast cancer.  NARRATIVE:  Chloe Watkins was seen today for weekly assessment.  She has completed 2340 cGy of a planned 6300 cGy directed at the left breast area.  The patient did have an appointment to see her primary care physician, Dr. Neale Watkins, but unfortunately the patient did not stay for this appointment.  I have encouraged the patient to reschedule this appointment.  She continues to have some dizziness in getting off the examination table.  The patient denies any syncopal episodes.  She has noticed some swelling in the left supraclavicular region.  The patient denies any itching or significant discomfort in the treatment area.  The patient has noticed a little bit of a sore throat.  EXAMINATION:  Vital Signs:  The patient's blood pressure sitting is 141/88 with pulse of 64; standing blood pressure is 123/81 with pulse of 73.  Examination of the lungs reveals them to be clear.  The heart has a regular rhythm and rate.  Examination of the oral cavity reveals no secondary infection or mucosal lesion.  There is no erythema appreciated.  The patient does have some of some soft tissue swelling in the left supraclavicular region.  There are no obvious signs of infection. Careful palpation in the left supraclavicular region reveals no evidence of adenopathy.  The left axilla is free of adenopathy.  The patient does have some puffiness to the left axillary region in addition.  The left breast area shows some mild erythema and hyperpigmentation changes.  IMPRESSION AND PLAN:  The patient is tolerating her treatments reasonably well except for issues as above.  The patient's radiation fields are setting up accurately.  The patient's radiation chart was checked today.  Plan is to continue to a cumulative dose of 6300 cGy. The patient was inquiring about vitamin E.  I have recommend she hold off on this and to use pure aloe vera gel if she would like to use something different  than RadiaPlex.    ______________________________ Billie Lade, Ph.D., M.D. JDK/MEDQ  D:  07/11/2011  T:  07/11/2011  Job:  [id number]

## 2011-07-11 NOTE — Progress Notes (Signed)
Pt ambulatory steady gait, pt alert and oriented x 3, patient went to Dr. Wylene Simmer office last Tuesday waited an hour  Patient was told "they forgot you were sitting waiting to be seen, pt left without being seen", pt left breast area slight erythema, left side neck some swelling, pt c/o last Friday after treatment she went home neck ,throat sore, redness and she used vitamin e over that area over the weekend, throat better today stated pt 10:17 AM

## 2011-07-12 ENCOUNTER — Ambulatory Visit
Admission: RE | Admit: 2011-07-12 | Discharge: 2011-07-12 | Disposition: A | Discharge status: Home / Self Care | Payer: Medicare Other | Source: Ambulatory Visit | Attending: Radiation Oncology | Admitting: Radiation Oncology

## 2011-07-13 ENCOUNTER — Ambulatory Visit
Admission: RE | Admit: 2011-07-13 | Discharge: 2011-07-13 | Disposition: A | Discharge status: Home / Self Care | Payer: Medicare Other | Source: Ambulatory Visit | Attending: Radiation Oncology | Admitting: Radiation Oncology

## 2011-07-14 ENCOUNTER — Ambulatory Visit
Admission: RE | Admit: 2011-07-14 | Discharge: 2011-07-14 | Disposition: A | Discharge status: Home / Self Care | Payer: Medicare Other | Source: Ambulatory Visit | Attending: Radiation Oncology | Admitting: Radiation Oncology

## 2011-07-17 ENCOUNTER — Ambulatory Visit
Admission: RE | Admit: 2011-07-17 | Discharge: 2011-07-17 | Disposition: A | Discharge status: Home / Self Care | Payer: Medicare Other | Source: Ambulatory Visit | Attending: Radiation Oncology | Admitting: Radiation Oncology

## 2011-07-18 ENCOUNTER — Ambulatory Visit
Admission: RE | Admit: 2011-07-18 | Discharge: 2011-07-18 | Disposition: A | Discharge status: Home / Self Care | Payer: Medicare Other | Source: Ambulatory Visit | Attending: Radiation Oncology | Admitting: Radiation Oncology

## 2011-07-18 DIAGNOSIS — C50919 Malignant neoplasm of unspecified site of unspecified female breast: Secondary | ICD-10-CM

## 2011-07-18 NOTE — Progress Notes (Signed)
Weekly Management Note Current Dose:  32.4 Gy  Projected Dose: 63 Gy   Narrative:  The patient presents for routine under treatment assessment.  CBCT/MVCT images/Port film x-rays were reviewed.  The chart was checked. Doing well. Some fatigue which started on Saturday. Using radiaplex/aloe. Discussed her sensations of chest pressure/heart fluttering that started the first week of treatment but have not recurred. Discussed previously with Dr. Roselind Messier.   Physical Findings: No skin changes. In no distress.   Impression:  The patient is tolerating radiation.  Plan:  Continue treatment as planned. Encouraged pt to let us know if she experiences any further chest pressure. Continue radiaplex/aloe.

## 2011-07-18 NOTE — Progress Notes (Signed)
Here for routine weekly under treat  Visit for left breast cancer. Has completed 18 radiation treatments. Mild skin changes. Denies pain. Uses aloe vera as well as radiaplex.

## 2011-07-19 ENCOUNTER — Ambulatory Visit
Admission: RE | Admit: 2011-07-19 | Discharge: 2011-07-19 | Disposition: A | Discharge status: Home / Self Care | Payer: Medicare Other | Source: Ambulatory Visit | Attending: Radiation Oncology | Admitting: Radiation Oncology

## 2011-07-20 ENCOUNTER — Ambulatory Visit
Admission: RE | Admit: 2011-07-20 | Discharge: 2011-07-20 | Disposition: A | Discharge status: Home / Self Care | Payer: Medicare Other | Source: Ambulatory Visit | Attending: Radiation Oncology | Admitting: Radiation Oncology

## 2011-07-21 ENCOUNTER — Ambulatory Visit: Payer: Medicare Other

## 2011-07-24 ENCOUNTER — Ambulatory Visit
Admission: RE | Admit: 2011-07-24 | Discharge: 2011-07-24 | Disposition: A | Discharge status: Home / Self Care | Payer: Medicare Other | Source: Ambulatory Visit | Attending: Radiation Oncology | Admitting: Radiation Oncology

## 2011-07-25 ENCOUNTER — Ambulatory Visit
Admission: RE | Admit: 2011-07-25 | Discharge: 2011-07-25 | Disposition: A | Discharge status: Home / Self Care | Payer: Medicare Other | Source: Ambulatory Visit | Attending: Radiation Oncology | Admitting: Radiation Oncology

## 2011-07-25 ENCOUNTER — Encounter: Payer: Self-pay | Admitting: Radiation Oncology

## 2011-07-25 ENCOUNTER — Telehealth: Payer: Self-pay | Admitting: *Deleted

## 2011-07-25 ENCOUNTER — Ambulatory Visit (HOSPITAL_BASED_OUTPATIENT_CLINIC_OR_DEPARTMENT_OTHER): Payer: Medicare Other | Admitting: Oncology

## 2011-07-25 ENCOUNTER — Other Ambulatory Visit: Payer: Medicare Other | Admitting: Lab

## 2011-07-25 VITALS — Wt 168.7 lb

## 2011-07-25 VITALS — BP 149/91 | HR 65 | Temp 98.0°F | Ht 65.0 in | Wt 169.8 lb

## 2011-07-25 DIAGNOSIS — C50919 Malignant neoplasm of unspecified site of unspecified female breast: Secondary | ICD-10-CM

## 2011-07-25 DIAGNOSIS — Z09 Encounter for follow-up examination after completed treatment for conditions other than malignant neoplasm: Secondary | ICD-10-CM

## 2011-07-25 DIAGNOSIS — Z170 Estrogen receptor positive status [ER+]: Secondary | ICD-10-CM

## 2011-07-25 NOTE — Progress Notes (Signed)
Treatment note  Chloe Watkins is seen today for weekly assessment. She's completed 22/34 planned treatments recommend directed the left breast and local regional area. This does have some soreness and dryness within the left breast. Last week she did have one episode of experiencing some dizziness upon getting up off the treatment table. I have again discussed with patient that she should see her primary care physician concerning this issue. Patient may have  an inner ear problem.  Current dose 3960 cGy of a planned 6300 cGy  Examination:  the left breast shows some erythema and hyperpigmentation changes.  Patient is tolerating her treatments well except for issues as above. The patient's radiation fields are setting up accurately. Patient's radiation chart was checked today. Plan is to continue to a cumulative dose of 6300 centigrade.

## 2011-07-25 NOTE — Progress Notes (Signed)
Here for weekly routine under treat visit with MD for radiation to left breast. Mild discoloration of left breast , no breaks in skin. Mild achiness of breast and shoulder.

## 2011-07-25 NOTE — Telephone Encounter (Signed)
made patient appointment for 10-25-2011 starting at 10:00am

## 2011-07-26 ENCOUNTER — Ambulatory Visit
Admission: RE | Admit: 2011-07-26 | Discharge: 2011-07-26 | Disposition: A | Discharge status: Home / Self Care | Payer: Medicare Other | Source: Ambulatory Visit | Attending: Radiation Oncology | Admitting: Radiation Oncology

## 2011-07-27 ENCOUNTER — Ambulatory Visit
Admission: RE | Admit: 2011-07-27 | Discharge: 2011-07-27 | Disposition: A | Discharge status: Home / Self Care | Payer: Medicare Other | Source: Ambulatory Visit | Attending: Radiation Oncology | Admitting: Radiation Oncology

## 2011-07-28 ENCOUNTER — Ambulatory Visit
Admission: RE | Admit: 2011-07-28 | Discharge: 2011-07-28 | Disposition: A | Discharge status: Home / Self Care | Payer: Medicare Other | Source: Ambulatory Visit | Attending: Radiation Oncology | Admitting: Radiation Oncology

## 2011-07-30 NOTE — Progress Notes (Signed)
 ID: [name]   DOB: 08/17/1935  MR#: [id number]  CSN#:[id number]  HISTORY OF PRESENT ILLNESS: The patient is a 76 year old [place] woman who on 07/28/2010 had [facility]. In November 2012 however, when she saw her gynecologist, Chloe Watkins, he noted a mass in her left breast and set her up for left mammography and ultrasonography, performed 04/12/2011. This showed a high density lesion with irregular margins in the upper outer quadrant of the left breast which was palpable and mobile. The axilla was unremarkable by palpation and ultrasonography. Biopsy of this mass was performed 04/21/2011, and showed [phone]) an invasive ductal carcinoma, high-grade, estrogen receptor 100% positive, progesterone receptor 97% positive, with an MIB-1 of 27%, and no HER-2 amplification.  The patient then had a right diagnostic mammogram and 05/05/2011, which was unremarkable. Bilateral breast MRIs in 05/15/2011 showed a 2.1 cm enhancing mass in the upper outer left breast, with no other abnormal areas in either breast and no abnormal lymph nodes, bony or upper hepatic abnormalities noted.  On 05/24/2010, the patient underwent left lumpectomy and sentinel lymph node sampling under [facility]. This showed ([id number]) an invasive ductal carcinoma, grade 3, measuring 2.2 cm, with one of 2 sentinel lymph nodes involved by micrometastatic deposit. Margins were negative. Repeat HER-2 assessment was again negative. The patient's subsequent treatment is as detailed below.  INTERVAL HISTORY: Chloe Watkins returns today for followup of her breast cancer. She is undergoing radiation, to be completed April 11.  REVIEW OF SYSTEMS: Overall she is tolerating her radiation well. She is mildly fatigued, and has some erythema, but no desquamation. She is having problems with vertigo, or dizziness, which occurs when she arises from a lying position. This is accompanied by mild nausea. When she  is vertical the symptoms resolve. She has had this problem in the past. Otherwise a detailed review of systems today is noncontributory  PAST MEDICAL HISTORY: Past Medical History  Diagnosis Date  . Anemia   . Anxiety   . Clotting disorder   . Hypertension   . Osteoporosis   . Thyroid disease   . Chills   . Hearing loss   . Leg swelling   . Diarrhea   . Arthritis pain   . Headache   . Bruises easily   . Arthritis     OSTEOPOROSIS  . Breast cancer 04/21/2011    INVASIVE DUCTAL CA    PAST SURGICAL HISTORY: Past Surgical History  Procedure Date  . Vein ligation and stripping 1960  . Uterine fibroid surgery 1975  . Ankle surgery 2000  . Wrist surgery 2000    right  . Eye surgery     lt cataract  . Breast lumpectomy 05/25/11    L breast, node bx: inv ductal, dcis, 1/2 nodes pos, ER/PR +, HER 2 -  . Cholecystectomy 1970    FAMILY HISTORY The patient's father died in his 24s from heart disease. The patient's mother died following a stroke at the age of 74. The patient had 4 sisters, one brother. The brother died from kidney cancer. One sister died 2 weeks ago from complications of diabetes. One sister died from melanoma at the age of 21. The 2 other sisters died secondary to strokes. There is no history of breast or ovarian cancer in the family.   GYNECOLOGIC HISTORY: GX P2, first pregnancy to term age 70. She had menopause age 18. She took hormones very briefly (discontinued because of HTN)  SOCIAL HISTORY: The  patient used to work for [employer], but became disabled secondary to her multiple back injuries. She is divorced and lives by herself. Her son Chloe Watkins lives in [place] where he works at a Manufacturing systems engineer. Her daughter Chloe Watkins lives in [place]., a few miles from the patient. She is a Futures trader. The patient has 1 grandchild and 2 great-grandchildren. She is not a Advice worker.    ADVANCED DIRECTIVES: not in place  HEALTH MAINTENANCE: History    Substance Use Topics  . Smoking status: Former Smoker    Quit date: 05/07/1981  . Smokeless tobacco: Never Used  . Alcohol Use: No     Colonoscopy: never  PAP: UTD ([place])  Bone density: 2012/ osteoporosis  Lipid panel:  Allergies  Allergen Reactions  . Penicillins Itching and Swelling    Current Outpatient Prescriptions  Medication Sig Dispense Refill  . Ascorbic Acid (VITAMIN C) 1000 MG tablet Take 1,000 mg by mouth 2 (two) times daily.      Marland Kitchen atenolol (TENORMIN) 50 MG tablet Take 50 mg by mouth daily.        . Calcium Carbonate Antacid (TUMS ULTRA 1000 PO) Take by mouth 3 (three) times daily.      . cholecalciferol (VITAMIN D) 1000 UNITS tablet Take 1,000 Units by mouth daily.      . MULTIPLE VITAMINS PO Take 1 tablet by mouth.        OBJECTIVE: Filed Vitals:   07/25/11 1122  BP: 149/91  Pulse: 65  Temp: 98 F (36.7 C)     Body mass index is 28.26 kg/(m^2).    ECOG FS: 2  Sclerae unicteric Oropharynx clear No peripheral adenopathy Lungs no rales or rhonchi Heart regular rate and rhythm Abd benign MSK no focal spinal tenderness, no peripheral edema Neuro: nonfocal Breasts: The right breast is unremarkable; the left breast is status post lumpectomy. There is mild erythema, but no desquamation noted. There is no evidence of local recurrence  LAB RESULTS: Lab Results  Component Value Date   WBC 8.2 06/05/2011   NEUTROABS 5.0 06/05/2011   HGB 14.5 06/05/2011   HCT 44.3 06/05/2011   MCV 91.5 06/05/2011   PLT 212 06/05/2011      Chemistry      Component Value Date/Time   NA 136 06/05/2011 1607   K 4.0 06/05/2011 1607   CL 100 06/05/2011 1607   CO2 27 06/05/2011 1607   BUN 12 06/05/2011 1607   CREATININE 0.87 06/05/2011 1607      Component Value Date/Time   CALCIUM 9.6 06/05/2011 1607   ALKPHOS 87 06/05/2011 1607   AST 22 06/05/2011 1607   ALT 20 06/05/2011 1607   BILITOT 0.4 06/05/2011 1607       Lab Results  Component Value Date   LABCA2 25 06/05/2011     No components found with this basename: [id number]    No results found for this basename: INR:1;PROTIME:1 in the last 168 hours  Urinalysis    Component Value Date/Time   COLORURINE YELLOW 05/22/2011 1102   APPEARANCEUR CLOUDY* 05/22/2011 1102   LABSPEC 1.021 05/22/2011 1102   PHURINE 6.0 05/22/2011 1102   GLUCOSEU NEGATIVE 05/22/2011 1102   HGBUR NEGATIVE 05/22/2011 1102   BILIRUBINUR NEGATIVE 05/22/2011 1102   KETONESUR NEGATIVE 05/22/2011 1102   PROTEINUR NEGATIVE 05/22/2011 1102   UROBILINOGEN 1.0 05/22/2011 1102   NITRITE NEGATIVE 05/22/2011 1102   LEUKOCYTESUR MODERATE* 05/22/2011 1102    STUDIES: No new results found.  ASSESSMENT:  76 year old [place] woman status post left lumpectomy and sentinel lymph node sampling 05/25/2011 for a T2 N1 (mic), Stage 2A invasive ductal carcinoma, grade 3, strongly estrogen and progesterone receptor positive, HER-2 negative, with an MIB-1 of 27%.   PLAN: She will complete her radiation treatments in mid April, and we spent the better part of today's visit discussing anti-estrogen therapy. Given her past medical history and overall functional status, I would prefer to use an aromatase inhibitor, even though she has a history of osteoporosis. More specifically she will start letrozole as soon as she completes her radiation treatments. She will see me again in June. If she is tolerating the letrozole well at that time I will set her up for zoledronic acid yearly. It will be helpful for her to be evaluated by her dentist prior to that. She knows to call for any problems that may develop before her next visit here  , C    07/30/2011

## 2011-07-31 ENCOUNTER — Ambulatory Visit
Admission: RE | Admit: 2011-07-31 | Discharge: 2011-07-31 | Disposition: A | Discharge status: Home / Self Care | Payer: Medicare Other | Source: Ambulatory Visit | Attending: Radiation Oncology | Admitting: Radiation Oncology

## 2011-08-01 ENCOUNTER — Ambulatory Visit
Admission: RE | Admit: 2011-08-01 | Discharge: 2011-08-01 | Disposition: A | Discharge status: Home / Self Care | Payer: Medicare Other | Source: Ambulatory Visit | Attending: Radiation Oncology | Admitting: Radiation Oncology

## 2011-08-01 ENCOUNTER — Encounter: Payer: Self-pay | Admitting: Radiation Oncology

## 2011-08-01 VITALS — Wt 167.4 lb

## 2011-08-01 DIAGNOSIS — C50919 Malignant neoplasm of unspecified site of unspecified female breast: Secondary | ICD-10-CM

## 2011-08-01 NOTE — Progress Notes (Signed)
DIAGNOSIS:  Left breast cancer.  NARRATIVE:  Chloe Watkins is seen today for weekly assessment.  She has completed 4500 cGy of a planned 6300 cGy directed at the left breast region.  The patient is currently on her boost field directed at the upper outer aspect of the left breast.  The patient does have some sensitivity along the treatment area, particularly along the parasternal area.  She in addition does have some fatigue.  EXAMINATION:  The patient's lungs are clear.  The heart has a regular rhythm and rate.  Examination of the treatment area reveals erythema and hyperpigmentation changes.  Erythema is particularly noted along the clavicular area which is currently not being treated.  There is no moist desquamation noted.  IMPRESSION/PLAN:  The patient is tolerating her treatments well at this time.  The patient's radiation fields are setting up accurately.  The patient's radiation chart was checked today.  Plan is to continue to a cumulative dose of 6300 cGy.  The patient did meet with Dr. Darnelle Catalan recently, and she will be starting adjuvant hormonal therapy after she completes her radiation treatment.    ______________________________ Billie Lade, Ph.D., M.D. JDK/MEDQ  D:  08/01/2011  T:  08/01/2011  Job:  [id number]

## 2011-08-01 NOTE — Progress Notes (Signed)
Here for routine weekly md visit for radiation of left breast.Has completed 27 of 34 treatments. Hyperpigmented more over left clavicular region.No peeling or breaks in skin. Moderate fatigue, difficulty sleeping at night only up to 3 hours of sleep per night.

## 2011-08-02 ENCOUNTER — Ambulatory Visit: Payer: Medicare Other

## 2011-08-03 ENCOUNTER — Telehealth: Payer: Self-pay | Admitting: Radiation Oncology

## 2011-08-03 ENCOUNTER — Ambulatory Visit: Payer: Medicare Other

## 2011-08-03 NOTE — Telephone Encounter (Signed)
Patient phoned this morning informing our staff she would not be present for treatment today or tomorrow. Patient reports that she "isn't feeling well." Also, she reports nausea. Encouraged patient to contact her PCP. Patient verbalized understanding. Informed Melissa, RT on linac 3 of this finding then, routed this message to [facility], RN and Dr. Roselind Messier.

## 2011-08-04 ENCOUNTER — Ambulatory Visit: Payer: Medicare Other

## 2011-08-07 ENCOUNTER — Ambulatory Visit
Admission: RE | Admit: 2011-08-07 | Discharge: 2011-08-07 | Disposition: A | Discharge status: Home / Self Care | Payer: Medicare Other | Source: Ambulatory Visit | Attending: Radiation Oncology | Admitting: Radiation Oncology

## 2011-08-08 ENCOUNTER — Ambulatory Visit
Admission: RE | Admit: 2011-08-08 | Discharge: 2011-08-08 | Disposition: A | Discharge status: Home / Self Care | Payer: Medicare Other | Source: Ambulatory Visit | Attending: Radiation Oncology | Admitting: Radiation Oncology

## 2011-08-09 ENCOUNTER — Ambulatory Visit
Admission: RE | Admit: 2011-08-09 | Discharge: 2011-08-09 | Disposition: A | Discharge status: Home / Self Care | Payer: Medicare Other | Source: Ambulatory Visit | Attending: Radiation Oncology | Admitting: Radiation Oncology

## 2011-08-09 ENCOUNTER — Encounter: Payer: Self-pay | Admitting: Radiation Oncology

## 2011-08-09 ENCOUNTER — Ambulatory Visit: Payer: Medicare Other

## 2011-08-09 VITALS — BP 167/96 | HR 72 | Resp 18 | Wt 166.2 lb

## 2011-08-09 DIAGNOSIS — C50919 Malignant neoplasm of unspecified site of unspecified female breast: Secondary | ICD-10-CM

## 2011-08-09 NOTE — Progress Notes (Signed)
DIAGNOSIS:  Left breast cancer.  NARRATIVE:  Ms. Batterman is seen today for weekly assessment.  She has completed 5500 cGy of a planned 6300 cGy directed at the left breast area.  The patient does complain of some soreness and heavy feeling within the breast.  The patient is having some fatigue.  She denies any further problems with dizziness after getting off the treatment table. Patient has seen her internist in light of her problems with her blood pressure.  EXAMINATION:  The patient's weight is 166.2, pulse 72, blood pressure is elevated at 167/96.  Examination of the lungs reveals them to be clear. The heart has a regular rhythm and rate.  Examination of the left breast reveals erythema and hyperpigmentation changes but no moist desquamation.  IMPRESSION AND PLAN:  The patient is tolerating her treatments reasonably well except for issues as above.  The patient's radiation fields are setting up accurately.  The patient's radiation chart was checked today.  Plan was to continue with 4 additional treatments for a completion date on April 9.    ______________________________ Billie Lade, Ph.D., M.D. JDK/MEDQ  D:  08/09/2011  T:  08/09/2011  Job:  [id number]

## 2011-08-09 NOTE — Progress Notes (Signed)
Patient presents to the clinic today unaccompanied for under treat visit with Dr. Roselind Messier. Patient is alert and oriented to person, place, and time. No distress noted. Steady gait noted. Pleasant affect noted. Patient reports constant throbbing pain 5 on a scale of 0-10 in her left breast, center of her chest and left shoulder. Hyperpigmentation of the left breast noted without breaks in the skin. Patient reports using Radiaplex as directed. Patient's blood pressure elevated despite taking her atenolol this morning. Encouraged patient to follow up with her PCP. Patient verbalized understanding. Reported all findings to Dr. Roselind Messier.

## 2011-08-10 ENCOUNTER — Ambulatory Visit
Admission: RE | Admit: 2011-08-10 | Discharge: 2011-08-10 | Disposition: A | Discharge status: Home / Self Care | Payer: Medicare Other | Source: Ambulatory Visit | Attending: Radiation Oncology | Admitting: Radiation Oncology

## 2011-08-11 ENCOUNTER — Ambulatory Visit
Admission: RE | Admit: 2011-08-11 | Discharge: 2011-08-11 | Disposition: A | Discharge status: Home / Self Care | Payer: Medicare Other | Source: Ambulatory Visit | Attending: Radiation Oncology | Admitting: Radiation Oncology

## 2011-08-14 ENCOUNTER — Ambulatory Visit
Admission: RE | Admit: 2011-08-14 | Discharge: 2011-08-14 | Disposition: A | Discharge status: Home / Self Care | Payer: Medicare Other | Source: Ambulatory Visit | Attending: Radiation Oncology | Admitting: Radiation Oncology

## 2011-08-15 ENCOUNTER — Encounter: Payer: Self-pay | Admitting: Radiation Oncology

## 2011-08-15 ENCOUNTER — Ambulatory Visit
Admission: RE | Admit: 2011-08-15 | Discharge: 2011-08-15 | Disposition: A | Discharge status: Home / Self Care | Payer: Medicare Other | Source: Ambulatory Visit | Attending: Radiation Oncology | Admitting: Radiation Oncology

## 2011-08-15 DIAGNOSIS — C50919 Malignant neoplasm of unspecified site of unspecified female breast: Secondary | ICD-10-CM

## 2011-08-15 NOTE — Progress Notes (Signed)
DIAGNOSIS:  Left breast cancer.  NARRATIVE:  Chloe Watkins is seen today for weekly assessment.  She actually completed her last radiation therapy directed at the left breast earlier today (6300 cGy).  The patient denies any further problems with dizziness or vertigo symptoms upon getting off the treatment table.  The patient does have some soreness in the left breast and axillary area at this time.  PHYSICAL EXAMINATION:  The lungs are clear.  The heart has a regular rhythm and rate.  Examination of the left breast area reveals some erythema and hyperpigmentation changes but no moist desquamation.  IMPRESSION AND PLAN:  The patient as above has completed her therapy. Her radiation fields set up accurately this week.  The patient's radiation chart was checked today.  The patient will be set up for followup in 1 month.    ______________________________ Billie Lade, Ph.D., M.D. JDK/MEDQ  D:  08/15/2011  T:  08/15/2011  Job:  [id number]

## 2011-08-15 NOTE — Progress Notes (Signed)
CC:   Chloe Watkins, M.D. [name], M.D. [name], M.D.  DIAGNOSIS:  Stage IIA invasive ductal carcinoma of the left breast.  INDICATION FOR THERAPY:  Breast conservation and coverage of the axillary region.  TREATMENT DATES:  June 22, 2011, through August 15, 2011.  SITE/DOSE:  Left breast, axillary and supraclavicular region, 4500 cGy in 25 fractions (180 cGy per fraction).  The site of presentation in the upper outer aspect of the breast area was boosted further to a cumulative dose of 6300 cGy.  ENERGY/FIELD:  The patient was initially treated with a 4-field setup with tangential beams encompassing the left breast.  Matched to this was a right anterior oblique and a PA field.  The patient was treated with a combination of 6 and 10 MV photons.  The patient's electron boost field directed at the site of presentation in the upper outer aspect of the left breast was with 15 MeV electrons prescribed to the 95% isodose line.  NARRATIVE:  [name] tolerated her treatments reasonably well.  She did have some problems initially with some dizziness/vertigo symptoms upon getting up off the treatment table.  The patient did not fall with any of these issues.  Toward the end of the patient's treatment, she did experience some fatigue as well as some pruritus and discomfort in the area but no moist desquamation.  FOLLOWUP APPOINTMENT:  1 month.    ______________________________ Billie Lade, Ph.D., M.D. JDK/MEDQ  D:  08/15/2011  T:  08/15/2011  Job:  [id number]

## 2011-08-15 NOTE — Progress Notes (Signed)
Routine under treat visist for left breast ca.Patient completes 74 radiation treatments. To schedule follow up for one month. Started taking extra-strength tylenol on Friday for left shoulder blade and left axilla pain.

## 2011-08-23 ENCOUNTER — Telehealth: Payer: Self-pay | Admitting: *Deleted

## 2011-08-23 NOTE — Telephone Encounter (Signed)
This RN spoke with pt per concerns relayed to reach for recovery volunteer.  Chloe Watkins states since starting letrozole she has developed multiplied concerns.  She noticed a dull headache at the base of her skull.  She states she has overall body aches but more concerning is she " feels like my chest is very tight and I can't get a deep breath "  Chloe Watkins also woke up in the middle of the night ( Tuesday am ) " and my left leg from the foot to the knee was totally numb"  Above resided with massage and elevation.  Chloe Watkins then called the [employer] to discuss and was told of concern with use of letrozole and strokes.  Per further inquiry above symptom has not recurred.  She denies any left arm pain or numbness except for ongoing left shoulder pain which has been present prior to use of letrozole.  Skin is warm and dry to touch.  This RN discussed with pt overall symptoms except for body aches is not usual for letrozole and concern for additional work up.  Plan at present is pt will hold letrozole ( which she actually stopped yesterday ) and she will contact primary MD for appt this week.  This note will be given to MD for review as well as pt will call with update.

## 2011-08-25 NOTE — Progress Notes (Signed)
DIAGNOSIS:  Breast cancer.  On July 12, 2005, Ms. Chloe Watkins had setup of her custom electron cutout field directed at the left breast area.  The patient's treatment planning CT scan was reviewed and she subsequently had setup of a custom electron cutout field.  The patient will be treated with 15 MeV electrons prescribed to the 95% isodose line.  A special port plan is requested for treatment.    ______________________________ Billie Lade, Ph.D., M.D. JDK/MEDQ  D:  08/23/2011  T:  08/25/2011  Job:  [id number]

## 2011-09-11 ENCOUNTER — Ambulatory Visit: Payer: Medicare Other | Admitting: Radiation Oncology

## 2011-09-12 ENCOUNTER — Telehealth: Payer: Self-pay | Admitting: *Deleted

## 2011-09-12 NOTE — Telephone Encounter (Signed)
Pt called to this RN to give update post call on 08/23/2011. Dashea has continued off the letrozole and went to her primary MD for cardiac concerns.  Dr Wylene Simmer referred her to Dr Jacinto Halim and she is undergoing cardiac workup presently with a test scheduled for 09/21/2011.  " he said the EKG was good and he didn't hear any abnormality but wanted to do additional testing due to concern radiation could have irritated my heart. "  Pt is then scheduled to see Dr Roselind Messier on 5/17.  Plan at present is pt will continue off the letrozole and proceed with above with current follow up with Dr Darnelle Catalan 10/25/2011 and if needed rescheduled to earlier in June.  This note will be given to MD for review and any additional recommendations.

## 2011-09-22 ENCOUNTER — Ambulatory Visit (INDEPENDENT_AMBULATORY_CARE_PROVIDER_SITE_OTHER): Payer: Medicare Other | Admitting: Surgery

## 2011-09-22 ENCOUNTER — Encounter (INDEPENDENT_AMBULATORY_CARE_PROVIDER_SITE_OTHER): Payer: Self-pay | Admitting: Surgery

## 2011-09-22 VITALS — BP 120/74 | HR 60 | Temp 97.6°F | Resp 12 | Ht 65.0 in | Wt 165.6 lb

## 2011-09-22 DIAGNOSIS — C50919 Malignant neoplasm of unspecified site of unspecified female breast: Secondary | ICD-10-CM

## 2011-09-22 NOTE — Progress Notes (Signed)
NAME: BASIL BLAKESLEY       DOB: 10-18-1935           DATE: 09/22/2011       MRN: [id number]   STOREY STANGELAND is a 76 y.o.Marland Kitchenfemale who presents for routine followup of her Left breast cancer, UOQ diagnosed in 2012 and treated with lumpectomy and SLN. She recently finished radiation and apparently had symptoms during radiation. She had an echo to check for heart damage, but the result is pending.  PFSH: She has had no significant changes since the last visit here.  ROS: There have been no significant changes since the last visit here  EXAM: General: The patient is alert, oriented, generally healty appearing, NAD. Mood and affect are normal. VS:BP 120/74  Pulse 60  Temp(Src) 97.6 F (36.4 C) (Temporal)  Resp 12  Ht 5\' 5"  (1.651 m)  Wt 165 lb 9.6 oz (75.116 kg)  BMI 27.56 kg/m2   Breasts:  The right breast is normal. The left shows some increased pigmentation and some edema around the nipple areolar area. Not tender  Lymphatics: She has no axillary or supraclavicular adenopathy on either side.  Extremities: Full ROM of the surgical side with no lymphedema noted.  Data Reviewed: Notes from Dr Roselind Messier  Impression: Doing well, with no evidence of recurrent cancer or new cancer  Plan: Will continue to follow up on an annual basis here with visit in December.

## 2011-09-26 ENCOUNTER — Encounter (HOSPITAL_COMMUNITY): Admission: RE | Admit: 2011-09-26 | Payer: Medicare Other | Source: Ambulatory Visit

## 2011-09-27 ENCOUNTER — Ambulatory Visit
Admission: RE | Admit: 2011-09-27 | Discharge: 2011-09-27 | Disposition: A | Discharge status: Home / Self Care | Payer: Medicare Other | Source: Ambulatory Visit | Attending: Radiation Oncology | Admitting: Radiation Oncology

## 2011-09-27 ENCOUNTER — Encounter: Payer: Self-pay | Admitting: Radiation Oncology

## 2011-09-27 VITALS — BP 138/85 | HR 69 | Resp 18 | Wt 165.5 lb

## 2011-09-27 DIAGNOSIS — C50919 Malignant neoplasm of unspecified site of unspecified female breast: Secondary | ICD-10-CM

## 2011-09-27 NOTE — Progress Notes (Signed)
  Radiation Oncology         [phone]) [phone] ________________________________  Name: Chloe Watkins MRN: [id number]  Date: 09/27/2011  DOB: Apr 12, 1936  Follow-Up Visit Note  CC: Gaspar Garbe, MD, MD  [name], MD  Diagnosis:   Left breast cancer  Interval Since Last Radiation:  5 weeks  Narrative:  The patient returns today for routine follow-up.  She is doing well except for some mild discomfort in the left breast area. Patient denies any problems with swelling in her left arm or hand. She  was taken off of femara in light of  chest discomfort.                              ALLERGIES:  is allergic to penicillins.  Meds: Current Outpatient Prescriptions  Medication Sig Dispense Refill  . acetaminophen (TYLENOL) 500 MG tablet Take 500 mg by mouth every 6 (six) hours as needed.      . Ascorbic Acid (VITAMIN C) 1000 MG tablet Take 1,000 mg by mouth 2 (two) times daily.      Marland Kitchen atenolol (TENORMIN) 50 MG tablet Take 50 mg by mouth daily.        . Calcium Carbonate Antacid (TUMS ULTRA 1000 PO) Take by mouth 3 (three) times daily.      . cholecalciferol (VITAMIN D) 1000 UNITS tablet Take 1,000 Units by mouth daily.      . MULTIPLE VITAMINS PO Take 1 tablet by mouth.        Physical Findings: The patient is in no acute distress. Patient is alert and oriented.  weight is 165 lb 8 oz (75.07 kg). Her blood pressure is 138/85 and her pulse is 69. Her respiration is 18. .  The lungs are clear. The heart has regular rhythm and rate. Examination left breast reveals mild hyperpigmentation changes. There is mild induration at lumpectomy scar but no dominant masses appreciated in the breast.  The right breast is free of any palpable mass.  Lab Findings: Lab Results  Component Value Date   WBC 8.2 06/05/2011   HGB 14.5 06/05/2011   HCT 44.3 06/05/2011   MCV 91.5 06/05/2011   PLT 212 06/05/2011    @LASTCHEM @  Radiographic Findings: No results found.  Impression:  The patient is  recovering from the effects of radiation.    Plan:  Routine followup in 6 months.  _____________________________________    Billie Lade, PhD, MD

## 2011-09-27 NOTE — Progress Notes (Signed)
HERE TODAY FOR FU OF LEFT BREAST TX.  SKIN LOOKS GREAT.   NO C/O.  HAD TO BE TAKEN OFF OF FEMARA DUE TO PRESSURE IN CHEST AND BONES ACHING.  WENT TO HER CARDIOLOGIST.  CARDIOLOGIST DID ECHOCARDIOGRAM.  DR.GONGI.  SAW DR. Jamey Ripa Monday AND HE SAID SHE HAD DONE EXCELLENT.  TO SEE DR. MAGRINAT IN [place]

## 2011-10-04 NOTE — Progress Notes (Signed)
Encounter addended by: Glennie Hawk, RN on: 10/04/2011  2:28 PM<BR>     Documentation filed: Notes Section, Charges VN

## 2011-10-04 NOTE — Progress Notes (Signed)
error 

## 2011-10-25 ENCOUNTER — Telehealth: Payer: Self-pay | Admitting: *Deleted

## 2011-10-25 ENCOUNTER — Ambulatory Visit (HOSPITAL_BASED_OUTPATIENT_CLINIC_OR_DEPARTMENT_OTHER): Payer: Medicare Other | Admitting: Oncology

## 2011-10-25 ENCOUNTER — Other Ambulatory Visit (HOSPITAL_BASED_OUTPATIENT_CLINIC_OR_DEPARTMENT_OTHER): Payer: Medicare Other | Admitting: Lab

## 2011-10-25 VITALS — BP 137/83 | HR 60 | Temp 98.3°F | Ht 65.0 in | Wt 165.2 lb

## 2011-10-25 DIAGNOSIS — C773 Secondary and unspecified malignant neoplasm of axilla and upper limb lymph nodes: Secondary | ICD-10-CM

## 2011-10-25 DIAGNOSIS — C50919 Malignant neoplasm of unspecified site of unspecified female breast: Secondary | ICD-10-CM

## 2011-10-25 DIAGNOSIS — C50419 Malignant neoplasm of upper-outer quadrant of unspecified female breast: Secondary | ICD-10-CM

## 2011-10-25 DIAGNOSIS — Z170 Estrogen receptor positive status [ER+]: Secondary | ICD-10-CM

## 2011-10-25 DIAGNOSIS — E559 Vitamin D deficiency, unspecified: Secondary | ICD-10-CM

## 2011-10-25 LAB — CBC WITH DIFFERENTIAL/PLATELET
Eosinophils Absolute: 0.3 10*3/uL (ref 0.0–0.5)
MCV: 92.8 fL (ref 79.5–101.0)
MONO%: 12.6 % (ref 0.0–14.0)
NEUT#: 3.7 10*3/uL (ref 1.5–6.5)
RBC: 4.91 10*6/uL (ref 3.70–5.45)
RDW: 13.4 % (ref 11.2–14.5)
WBC: 5.7 10*3/uL (ref 3.9–10.3)
lymph#: 1 10*3/uL (ref 0.9–3.3)

## 2011-10-25 MED ORDER — LETROZOLE 2.5 MG PO TABS: 2.5000 mg | ORAL_TABLET | Freq: Every day | ORAL | Status: AC

## 2011-10-25 NOTE — Progress Notes (Signed)
ID: Chloe Watkins   DOB: [birth year]  MR#: [id number]  CSN#:[id number]  HISTORY OF PRESENT ILLNESS: The patient is a 76 year old [place] woman who on 07/28/2010 [facility] In November 2012 however, when she saw her gynecologist, Beather Arbour, he noted a mass in her left breast and set her up for left mammography and ultrasonography, performed 04/12/2011. This showed a high density lesion with irregular margins in the upper outer quadrant of the left breast which was palpable and mobile. The axilla was unremarkable by palpation and ultrasonography. Biopsy of this mass was performed 04/21/2011, and showed [id number] an invasive ductal carcinoma, high-grade, estrogen receptor 100% positive, progesterone receptor 97% positive, with an MIB-1 of 27%, and no HER-2 amplification.  The patient then had a right diagnostic mammogram and 05/05/2011, which was unremarkable. Bilateral breast MRIs in 05/15/2011 showed a 2.1 cm enhancing mass in the upper outer left breast, with no other abnormal areas in either breast and no abnormal lymph nodes, bony or upper hepatic abnormalities noted.  On 05/24/2010, the patient underwent left lumpectomy and sentinel lymph node sampling under [facility]. This showed ([id number]) an invasive ductal carcinoma, grade 3, measuring 2.2 cm, with one of 2 sentinel lymph nodes involved by micrometastatic deposit. Margins were negative. Repeat HER-2 assessment was again negative. The patient's subsequent treatment is as detailed below.  INTERVAL HISTORY: Chloe Watkins returns today for followup of her breast cancer. When she completed radiation in April she started letrozole. If you days later she had some palpitations. She stopped the medication, saw Dr. to some back, he referred her to Dr. Jacinto Halim, and she had an echocardiogram which according to the patient and showed only some thickening of the heart muscle. She was already on atenolol and this  medication was not changed. She has not had any other problems related to palpitations since.  REVIEW OF SYSTEMS: Aside from that, she complains of hearing loss, shortness of breath when walking up stairs, pain at the axillary surgical sites, although arthritis and of muscle aches here and there, but no new symptoms or symptoms of concern for metastatic disease. She is bored, does allow walking outside, hopes to sell her house and move to a townhouse perhaps near [employer], and "I need a hobby". Overall a detailed review of systems today was noncontributory  PAST MEDICAL HISTORY: Past Medical History  Diagnosis Date  . Anemia   . Clotting disorder   . Hypertension   . Osteoporosis   . Thyroid disease   . Chills   . Hearing loss   . Leg swelling   . Diarrhea   . Arthritis pain   . Headache   . Bruises easily   . Breast cancer 04/21/2011    INVASIVE DUCTAL CA  . Arthritis     PAST SURGICAL HISTORY: Past Surgical History  Procedure Date  . Vein ligation and stripping 1960  . Uterine fibroid surgery 1975  . Ankle surgery 2000  . Wrist surgery 2000    right  . Eye surgery     lt cataract  . Breast lumpectomy 05/25/11    L breast, node bx: inv ductal, dcis, 1/2 nodes pos, ER/PR +, HER 2 -  . Cholecystectomy 1970    FAMILY HISTORY The patient's father died in his 12s from heart disease. The patient's mother died following a stroke at the age of 1. The patient had 4 sisters, one brother. The brother died from kidney cancer. One sister died  2 weeks ago from complications of diabetes. One sister died from melanoma at the age of 93. The 2 other sisters died secondary to strokes. There is no history of breast or ovarian cancer in the family.   GYNECOLOGIC HISTORY: GX P2, first pregnancy to term age 35. She had menopause age 69. She took hormones very briefly (discontinued because of HTN)  SOCIAL HISTORY: The patient used to work for [employer], but became disabled secondary to her  multiple back injuries. She is divorced and lives by herself. Her son Chloe Watkins lives in [place] where he works at a Manufacturing systems engineer. Her daughter Chloe Watkins lives in [place] a few miles from the patient. She is a Futures trader. The patient has 1 grandchild and 2 great-grandchildren. She is not a Advice worker.    ADVANCED DIRECTIVES: not in place  HEALTH MAINTENANCE: History  Substance Use Topics  . Smoking status: Former Smoker    Quit date: 05/07/1981  . Smokeless tobacco: Never Used  . Alcohol Use: No     Colonoscopy: never  PAP: UTD ([facility])  Bone density: 2012/ osteoporosis  Lipid panel:  Allergies  Allergen Reactions  . Penicillins Itching and Swelling    Current Outpatient Prescriptions  Medication Sig Dispense Refill  . acetaminophen (TYLENOL) 500 MG tablet Take 500 mg by mouth every 6 (six) hours as needed.      . Ascorbic Acid (VITAMIN C) 1000 MG tablet Take 1,000 mg by mouth 2 (two) times daily.      Marland Kitchen atenolol (TENORMIN) 50 MG tablet Take 50 mg by mouth daily.        . Calcium Carbonate Antacid (TUMS ULTRA 1000 PO) Take by mouth 3 (three) times daily.      . cholecalciferol (VITAMIN D) 1000 UNITS tablet Take 1,000 Units by mouth daily.      . MULTIPLE VITAMINS PO Take 1 tablet by mouth.        OBJECTIVE: Middle-aged white woman in no acute distress Filed Vitals:   10/25/11 0951  BP: 137/83  Pulse: 60  Temp: 98.3 F (36.8 C)     Body mass index is 27.49 kg/(m^2).    ECOG FS: 1  Sclerae unicteric Oropharynx clear No peripheral adenopathy Lungs no rales or rhonchi Heart regular rate and rhythm Abd benign MSK no focal spinal tenderness, no peripheral edema Neuro: nonfocal Breasts: The right breast is unremarkable; the left breast is status post lumpectomy and radiation. There is no evidence of local recurrence.   LAB RESULTS: Lab Results  Component Value Date   WBC 5.7 10/25/2011   NEUTROABS 3.7 10/25/2011   HGB 15.0 10/25/2011   HCT  45.6 10/25/2011   MCV 92.8 10/25/2011   PLT 177 10/25/2011      Chemistry      Component Value Date/Time   NA 136 06/05/2011 1607   K 4.0 06/05/2011 1607   CL 100 06/05/2011 1607   CO2 27 06/05/2011 1607   BUN 12 06/05/2011 1607   CREATININE 0.87 06/05/2011 1607      Component Value Date/Time   CALCIUM 9.6 06/05/2011 1607   ALKPHOS 87 06/05/2011 1607   AST 22 06/05/2011 1607   ALT 20 06/05/2011 1607   BILITOT 0.4 06/05/2011 1607       Lab Results  Component Value Date   LABCA2 25 06/05/2011    No components found with this basename: [id number]    No results found for this basename: INR:1;PROTIME:1 in the last 168 hours  Urinalysis    Component Value Date/Time   COLORURINE YELLOW 05/22/2011 1102   APPEARANCEUR CLOUDY* 05/22/2011 1102   LABSPEC 1.021 05/22/2011 1102   PHURINE 6.0 05/22/2011 1102   GLUCOSEU NEGATIVE 05/22/2011 1102   HGBUR NEGATIVE 05/22/2011 1102   BILIRUBINUR NEGATIVE 05/22/2011 1102   KETONESUR NEGATIVE 05/22/2011 1102   PROTEINUR NEGATIVE 05/22/2011 1102   UROBILINOGEN 1.0 05/22/2011 1102   NITRITE NEGATIVE 05/22/2011 1102   LEUKOCYTESUR MODERATE* 05/22/2011 1102    STUDIES: No new results found.  ASSESSMENT: 76 y.o.  [place] woman status post left lumpectomy and sentinel lymph node sampling 05/25/2011 for a T2 N1 (mic), Stage 2A invasive ductal carcinoma, grade 3, strongly estrogen and progesterone receptor positive, HER-2 negative, with an MIB-1 of 27%.  (1) completed radiation therapy April 2013  (2) starting letrozole this month   PLAN: I don't think the letrozole really had anything to do with her palpitations and I feel it's very safe for her to re start that medication. I went ahead and wrote her the prescription. She has a good understanding of the possible toxicities side effects and complications and she tells me Dr. Wylene Simmer has discussed with her the possibility of starting Reclast, which I favor. She'll see [place] again in 3 months. She will have her next  mammogram before that visit. If all is going well we will start seeing her on a once a year basis thereafter. She knows to call for any problems that may develop before then  Deborah Dondero C    10/25/2011

## 2011-10-25 NOTE — Telephone Encounter (Signed)
Made patient appointment for 01-25-2012 starting at 9:15am printed out calendar and gave to the patient

## 2011-10-26 LAB — COMPREHENSIVE METABOLIC PANEL
AST: 22 U/L (ref 0–37)
Albumin: 4.3 g/dL (ref 3.5–5.2)
Alkaline Phosphatase: 87 U/L (ref 39–117)
Glucose, Bld: 86 mg/dL (ref 70–99)
Potassium: 4.6 mEq/L (ref 3.5–5.3)
Sodium: 136 mEq/L (ref 135–145)
Total Protein: 7.1 g/dL (ref 6.0–8.3)

## 2012-01-25 ENCOUNTER — Telehealth: Payer: Self-pay | Admitting: *Deleted

## 2012-01-25 ENCOUNTER — Other Ambulatory Visit (HOSPITAL_BASED_OUTPATIENT_CLINIC_OR_DEPARTMENT_OTHER): Payer: Medicare Other | Admitting: Lab

## 2012-01-25 ENCOUNTER — Ambulatory Visit (HOSPITAL_BASED_OUTPATIENT_CLINIC_OR_DEPARTMENT_OTHER): Payer: Medicare Other | Admitting: Physician Assistant

## 2012-01-25 ENCOUNTER — Encounter: Payer: Self-pay | Admitting: Physician Assistant

## 2012-01-25 VITALS — BP 169/95 | HR 61 | Temp 98.2°F | Resp 20 | Ht 65.0 in | Wt 168.0 lb

## 2012-01-25 DIAGNOSIS — C50912 Malignant neoplasm of unspecified site of left female breast: Secondary | ICD-10-CM

## 2012-01-25 DIAGNOSIS — E559 Vitamin D deficiency, unspecified: Secondary | ICD-10-CM

## 2012-01-25 DIAGNOSIS — C50419 Malignant neoplasm of upper-outer quadrant of unspecified female breast: Secondary | ICD-10-CM

## 2012-01-25 DIAGNOSIS — C50919 Malignant neoplasm of unspecified site of unspecified female breast: Secondary | ICD-10-CM

## 2012-01-25 DIAGNOSIS — M810 Age-related osteoporosis without current pathological fracture: Secondary | ICD-10-CM | POA: Insufficient documentation

## 2012-01-25 DIAGNOSIS — Z170 Estrogen receptor positive status [ER+]: Secondary | ICD-10-CM

## 2012-01-25 LAB — COMPREHENSIVE METABOLIC PANEL (CC13)
ALT: 18 U/L (ref 0–55)
AST: 21 U/L (ref 5–34)
Creatinine: 0.8 mg/dL (ref 0.6–1.1)
Total Bilirubin: 0.6 mg/dL (ref 0.20–1.20)

## 2012-01-25 LAB — CBC WITH DIFFERENTIAL/PLATELET
BASO%: 0.7 % (ref 0.0–2.0)
LYMPH%: 22.7 % (ref 14.0–49.7)
MCHC: 33 g/dL (ref 31.5–36.0)
MCV: 92.5 fL (ref 79.5–101.0)
MONO%: 13.5 % (ref 0.0–14.0)
Platelets: 164 10*3/uL (ref 145–400)
RBC: 4.79 10*6/uL (ref 3.70–5.45)
WBC: 5 10*3/uL (ref 3.9–10.3)

## 2012-01-25 MED ORDER — GABAPENTIN 300 MG PO CAPS: 300.0000 mg | ORAL_CAPSULE | Freq: Every day | ORAL | Status: DC

## 2012-01-25 NOTE — Telephone Encounter (Signed)
lab in Jan 2014, 1 week before seeing AB  Patient aware of the apppointment

## 2012-01-25 NOTE — Progress Notes (Signed)
ID: Chloe Watkins   DOB: 1935/12/18  MR#: [id number]  CSN#:[id number]  HISTORY OF PRESENT ILLNESS: The patient is a 76 year old [place] woman who on 07/28/2010 [facility]. In November 2012 however, when she saw her gynecologist, Chloe Watkins, he noted a mass in her left breast and set her up for left mammography and ultrasonography, performed 04/12/2011. This showed a high density lesion with irregular margins in the upper outer quadrant of the left breast which was palpable and mobile. The axilla was unremarkable by palpation and ultrasonography. Biopsy of this mass was performed 04/21/2011, and showed [id number]) an invasive ductal carcinoma, high-grade, estrogen receptor 100% positive, progesterone receptor 97% positive, with an MIB-1 of 27%, and no HER-2 amplification.  The patient then had a right diagnostic mammogram and 05/05/2011, which was unremarkable. Bilateral breast MRIs in 05/15/2011 showed a 2.1 cm enhancing mass in the upper outer left breast, with no other abnormal areas in either breast and no abnormal lymph nodes, bony or upper hepatic abnormalities noted.  On 05/24/2010, the patient underwent left lumpectomy and sentinel lymph node sampling under [facility]. This showed ([id number]) an invasive ductal carcinoma, grade 3, measuring 2.2 cm, with one of 2 sentinel lymph nodes involved by micrometastatic deposit. Margins were negative. Repeat HER-2 assessment was again negative. The patient's subsequent treatment is as detailed below.  INTERVAL HISTORY: Chloe Watkins returns today for followup of her left breast cancer. She continues on letrozole 2.5 mg daily which she tolerates well. She does have some occasional hot flashes. She also has some postsurgical pain in the left breast and left axilla, along the lateral edge of the incision.   Interval history is generally unremarkable.  REVIEW OF SYSTEMS: Chloe Watkins has had no recent illnesses and  denies fevers or chills. She's had no skin changes. She denies abnormal bleeding. She's eating and drinking well with no nausea and no change in bowel or urinary habits. No cough, phlegm production, or increased shortness of breath. No chest pain or palpitations. No abnormal headaches or dizziness. No unusual myalgias or arthralgias.  A detailed review of systems is otherwise stable and noncontributory.   PAST MEDICAL HISTORY: Past Medical History  Diagnosis Date  . Anemia   . Clotting disorder   . Hypertension   . Osteoporosis   . Thyroid disease   . Chills   . Hearing loss   . Leg swelling   . Diarrhea   . Arthritis pain   . Headache   . Bruises easily   . Breast cancer 04/21/2011    INVASIVE DUCTAL CA  . Arthritis     PAST SURGICAL HISTORY: Past Surgical History  Procedure Date  . Vein ligation and stripping 1960  . Uterine fibroid surgery 1975  . Ankle surgery 2000  . Wrist surgery 2000    right  . Eye surgery     lt cataract  . Breast lumpectomy 05/25/11    L breast, node bx: inv ductal, dcis, 1/2 nodes pos, ER/PR +, HER 2 -  . Cholecystectomy 1970    FAMILY HISTORY The patient's father died in his 67s from heart disease. The patient's mother died following a stroke at the age of 11. The patient had 4 sisters, one brother. The brother died from kidney cancer. One sister died 2 weeks ago from complications of diabetes. One sister died from melanoma at the age of 29. The 2 other sisters died secondary to strokes. There is no history of breast  or ovarian cancer in the family.   GYNECOLOGIC HISTORY: GX P2, first pregnancy to term age 63. She had menopause age 74. She took hormones very briefly (discontinued because of HTN)  SOCIAL HISTORY: The patient used to work for [employer], but became disabled secondary to her multiple back injuries. She is divorced and lives by herself. Her son Chloe Watkins lives in [place] where he works at a Manufacturing systems engineer. Her daughter Chloe Watkins lives in [place]., a few miles from the patient. She is a Futures trader. The patient has 1 grandchild and 2 great-grandchildren. She is not a Advice worker.    ADVANCED DIRECTIVES: not in place  HEALTH MAINTENANCE: History  Substance Use Topics  . Smoking status: Former Smoker    Quit date: 05/07/1981  . Smokeless tobacco: Never Used  . Alcohol Use: No     Colonoscopy: never  PAP: UTD ([facility])  Bone density: 2012/ osteoporosis  Lipid panel:  Allergies  Allergen Reactions  . Penicillins Itching and Swelling    Current Outpatient Prescriptions  Medication Sig Dispense Refill  . acetaminophen (TYLENOL) 500 MG tablet Take 500 mg by mouth every 6 (six) hours as needed.      . Ascorbic Acid (VITAMIN C) 1000 MG tablet Take 1,000 mg by mouth 2 (two) times daily.      Marland Kitchen atenolol (TENORMIN) 50 MG tablet Take 50 mg by mouth daily.        . cholecalciferol (VITAMIN D) 1000 UNITS tablet Take 3,000 Units by mouth daily.       Marland Kitchen letrozole (FEMARA) 2.5 MG tablet Take 2.5 mg by mouth daily.      . MULTIPLE VITAMINS PO Take 1 tablet by mouth.      . gabapentin (NEURONTIN) 300 MG capsule Take 1 capsule (300 mg total) by mouth at bedtime.  30 capsule  11    OBJECTIVE: Middle-aged white woman in no acute distress Filed Vitals:   01/25/12 0928  BP: 169/95  Pulse: 61  Temp: 98.2 F (36.8 C)  Resp: 20     Body mass index is 27.96 kg/(m^2).    ECOG FS: 1 Filed Weights   01/25/12 0928  Weight: 168 lb (76.204 kg)   Sclerae unicteric Oropharynx clear No peripheral adenopathy Lungs no rales or rhonchi Heart regular rate and rhythm Abd soft, nontender; positive bowel sounds MSK no focal spinal tenderness, notable kyphosis on exam; no peripheral edema Neuro: nonfocal, alert and oriented x3 Breasts: The right breast is unremarkable; the left breast is status post lumpectomy and radiation. There is no evidence of local recurrence. The pill I are benign bilaterally with no  adenopathy   LAB RESULTS: Lab Results  Component Value Date   WBC 5.0 01/25/2012   NEUTROABS 2.9 01/25/2012   HGB 14.6 01/25/2012   HCT 44.3 01/25/2012   MCV 92.5 01/25/2012   PLT 164 01/25/2012      Chemistry      Component Value Date/Time   NA 136 01/25/2012 0916   NA 136 10/25/2011 0929   K 4.7 01/25/2012 0916   K 4.6 10/25/2011 0929   CL 101 01/25/2012 0916   CL 101 10/25/2011 0929   CO2 26 01/25/2012 0916   CO2 28 10/25/2011 0929   BUN 6.0* 01/25/2012 0916   BUN 10 10/25/2011 0929   CREATININE 0.8 01/25/2012 0916   CREATININE 0.77 10/25/2011 0929      Component Value Date/Time   CALCIUM 9.8 01/25/2012 0916   CALCIUM 9.0  10/25/2011 0929   ALKPHOS 105 01/25/2012 0916   ALKPHOS 87 10/25/2011 0929   AST 21 01/25/2012 0916   AST 22 10/25/2011 0929   ALT 18 01/25/2012 0916   ALT 16 10/25/2011 0929   BILITOT 0.60 01/25/2012 0916   BILITOT 0.7 10/25/2011 0929       Lab Results  Component Value Date   LABCA2 25 06/05/2011    STUDIES: No new results found.  ASSESSMENT: 76 y.o.  [place] woman status post left lumpectomy and sentinel lymph node sampling 05/25/2011 for a T2 N1 (mic), Stage 2A invasive ductal carcinoma, grade 3, strongly estrogen and progesterone receptor positive, HER-2 negative, with an MIB-1 of 27%.  (1) completed radiation therapy April 2013  (2) started letrozole in June 2013  (3) History of osteoporosis   PLAN:  Jenay will continue on letrozole as before, 2.5 mg daily. I have prescribed gabapentin, 300 mg at night, to help not only with hot flashes, but also with the postsurgical pain in the left axilla.   Liza is due for both her mammogram and a repeat bone density, but she has not decided where she wants to studies done. Accordingly, she did not want to order them today, but will contact her office within the next couple of weeks to let us know where to set these up. I will see her back in January to review those results, and also to discuss possible initiation  of zoledronic acid. This has been reviewed with Coda by Dr. Wylene Simmer, and if bone density still confirms osteoporosis, we would be in favor of beginning these infusions, especially while she is on letrozole.  If all is going well we will start seeing her on a once a year basis  after her appointment in January . She knows to call for any problems that may develop before then  [facility]    01/25/2012

## 2012-01-25 NOTE — Patient Instructions (Signed)
Continue letrozole, 2.5 mg daily.  Try gabapentin at bedtime for post-surgical pain on the left.  May also help with sleeplessness and decrease hot flashes. Prescription has been sent to [facility] in [place]  Call our office at [phone] and let us know where you have decided to have your mammogram and bone density so we can order and schedule right away.  Return in 1 year for repeat labs and follow up exam, but call prior to that appointment with any questions.

## 2012-01-25 NOTE — Telephone Encounter (Signed)
GAVE PATIENT APPOINTMENT FOR 05-14-2012 LAB ONLY 05-21-2012 MD  01-20-2013 10:00AM LAB ONLY  01-27-2013 MD

## 2012-01-26 LAB — VITAMIN D 25 HYDROXY (VIT D DEFICIENCY, FRACTURES): Vit D, 25-Hydroxy: 51 ng/mL (ref 30–89)

## 2012-02-01 ENCOUNTER — Other Ambulatory Visit: Payer: Self-pay | Admitting: Physician Assistant

## 2012-02-01 DIAGNOSIS — Z853 Personal history of malignant neoplasm of breast: Secondary | ICD-10-CM

## 2012-02-01 DIAGNOSIS — M810 Age-related osteoporosis without current pathological fracture: Secondary | ICD-10-CM

## 2012-02-05 ENCOUNTER — Telehealth: Payer: Self-pay | Admitting: *Deleted

## 2012-02-05 NOTE — Telephone Encounter (Signed)
Left voice message to inform the patient of the new date and time of the mammogram at the breast center

## 2012-02-08 ENCOUNTER — Encounter: Payer: Self-pay | Admitting: Dietician

## 2012-02-08 NOTE — Progress Notes (Signed)
Brief Out-patient Oncology Nutrition Note  Reason: Patient attended breast cancer nutrition course.   I have educated the patient on plant-based diet. I also discussed and provided nutrition handouts and research based evidence on breast cancer nutrition. We discussed nutrition management for symptoms associated with treatment. The class lasted a duration of 1 hour. The patient's asked good questions and were without any further nutrition related questions or concerns. RD contact information was provided. Patient's were instructed to contact RD for future nutrition questions or concerns.   RD available for nutrition needs.   Tuvia Woodrick D Arvel Oquinn, MS, RD, LDN [phone]  

## 2012-03-12 ENCOUNTER — Encounter (HOSPITAL_COMMUNITY): Payer: Self-pay | Admitting: Emergency Medicine

## 2012-03-12 ENCOUNTER — Emergency Department (HOSPITAL_COMMUNITY)
Admission: EM | Admit: 2012-03-12 | Discharge: 2012-03-12 | Disposition: A | Discharge status: Home / Self Care | Payer: Medicare Other | Source: Home / Self Care | Attending: Emergency Medicine | Admitting: Emergency Medicine

## 2012-03-12 ENCOUNTER — Emergency Department (HOSPITAL_COMMUNITY): Payer: Medicare Other

## 2012-03-12 DIAGNOSIS — S62102A Fracture of unspecified carpal bone, left wrist, initial encounter for closed fracture: Secondary | ICD-10-CM

## 2012-03-12 MED ORDER — HYDROCODONE-ACETAMINOPHEN 5-325 MG PO TABS: 1.0000 | ORAL_TABLET | Freq: Four times a day (QID) | ORAL | Status: DC | PRN

## 2012-03-12 NOTE — ED Notes (Signed)
MD at bedside. 

## 2012-03-12 NOTE — ED Provider Notes (Signed)
History     CSN: [id number] Arrival date & time 03/12/12  1925 First MD Initiated Contact with Patient 03/12/12 2004     Chief Complaint  Patient presents with  . Wrist Pain   HPI Comments: [place]  She landed on her knees and her left wrist.  Her wrist is hurting primarily at this time.  She can walk without difficulty.  No numbness.  Able to move fingers.  Patient is a 76 y.o. female presenting with wrist pain. The history is provided by the patient.  Wrist Pain This is a new problem. The current episode started less than 1 hour ago. The problem occurs constantly. The problem has not changed since onset.Exacerbated by: movement. Nothing relieves the symptoms. She has tried nothing for the symptoms.    Past Medical History  Diagnosis Date  . Anemia   . Clotting disorder   . Hypertension   . Osteoporosis   . Thyroid disease   . Chills   . Hearing loss   . Leg swelling   . Diarrhea   . Arthritis pain   . Headache   . Bruises easily   . Breast cancer 04/21/2011    INVASIVE DUCTAL CA  . Arthritis     Past Surgical History  Procedure Date  . Vein ligation and stripping 1960  . Uterine fibroid surgery 1975  . Ankle surgery 2000  . Wrist surgery 2000    right  . Eye surgery     lt cataract  . Breast lumpectomy 05/25/11    L breast, node bx: inv ductal, dcis, 1/2 nodes pos, ER/PR +, HER 2 -  . Cholecystectomy 1970    No family history on file.  History  Substance Use Topics  . Smoking status: Former Smoker    Quit date: 05/07/1981  . Smokeless tobacco: Never Used  . Alcohol Use: No    OB History    Grav Para Term Preterm Abortions TAB SAB Ect Mult Living                  Review of Systems  All other systems reviewed and are negative.    Allergies  Penicillins  Home Medications   Current Outpatient Rx  Name  Route  Sig  Dispense  Refill  . ACETAMINOPHEN 500 MG PO TABS   Oral   Take 500 mg by mouth every 6 (six) hours as  needed.         Marland Kitchen VITAMIN C 1000 MG PO TABS   Oral   Take 1,000 mg by mouth 2 (two) times daily.         . ATENOLOL 50 MG PO TABS   Oral   Take 50 mg by mouth daily.           Marland Kitchen VITAMIN D 1000 UNITS PO TABS   Oral   Take 3,000 Units by mouth daily.          Marland Kitchen GABAPENTIN 300 MG PO CAPS   Oral   Take 1 capsule (300 mg total) by mouth at bedtime.   30 capsule   11   . LETROZOLE 2.5 MG PO TABS   Oral   Take 2.5 mg by mouth daily.         . MULTIPLE VITAMINS PO   Oral   Take 1 tablet by mouth.           BP 180/95  Pulse 76  Temp 98.5 F (  36.9 C) (Oral)  Resp 16  SpO2 98%  Physical Exam  Nursing note and vitals reviewed. Constitutional: She appears well-developed and well-nourished. No distress.  HENT:  Head: Normocephalic and atraumatic.  Right Ear: External ear normal.  Left Ear: External ear normal.  Eyes: Conjunctivae normal are normal. Right eye exhibits no discharge. Left eye exhibits no discharge. No scleral icterus.  Neck: Neck supple. No tracheal deviation present.  Cardiovascular: Normal rate.   Pulmonary/Chest: Effort normal. No stridor. No respiratory distress.  Musculoskeletal: She exhibits no edema.       Left elbow: Normal.       Left wrist: She exhibits decreased range of motion, tenderness, bony tenderness and swelling. She exhibits no deformity and no laceration.       Right knee: She exhibits normal range of motion, no swelling, no effusion, no ecchymosis, no deformity and no laceration. no tenderness found.       Left knee: Normal.       Left hand: Normal.  Neurological: She is alert. Cranial nerve deficit: no gross deficits.  Skin: Skin is warm and dry. No rash noted.  Psychiatric: She has a normal mood and affect.    ED Course  Procedures (including critical care time)  Labs Reviewed - No data to display Dg Wrist Complete Left  03/12/2012  *RADIOLOGY REPORT*  Clinical Data: Larey Seat and injured left wrist.  LEFT WRIST - COMPLETE  3+ VIEW  Comparison: None.  Findings: Comminuted intra-articular fracture involving the distal radius which is mildly impacted.  Associated ulnar styloid fracture.  No fractures involving the carpal bones proper. Osteopenia.  IMPRESSION: Impacted comminuted intra-articular fracture involving the distal radius.  Associated ulnar styloid fracture.   Original Report Authenticated By: Hulan Saas, M.D.      1. Left wrist fracture       MDM  Patient was placed in a splint. He'll prescribe medications for pain. I will give her a referral to an orthopedic Dr. for further treatment of her wrist fracture        Celene Kras, MD 03/12/12 2034

## 2012-03-12 NOTE — ED Notes (Signed)
Pt alert, arrives from home, c/o left wrist pain s/p slip fall injury, pt resp even unlabored, skin pwd, PMS intact

## 2012-03-12 NOTE — ED Notes (Signed)
Discharge instructions reviewed. Rx given x1. All questions answered. 

## 2012-03-14 ENCOUNTER — Encounter (HOSPITAL_COMMUNITY): Payer: Self-pay | Admitting: *Deleted

## 2012-03-14 ENCOUNTER — Other Ambulatory Visit: Payer: Self-pay | Admitting: Orthopedic Surgery

## 2012-03-14 MED ORDER — VANCOMYCIN HCL IN DEXTROSE 1-5 GM/200ML-% IV SOLN
1000.0000 mg | INTRAVENOUS | Status: AC
  Administered 2012-03-15: 1000 mg via INTRAVENOUS
  Filled 2012-03-14: qty 200

## 2012-03-14 NOTE — Progress Notes (Signed)
CALLED DR. GRAMIG'S OFFICE FOR ORDERS.  

## 2012-03-15 ENCOUNTER — Encounter (HOSPITAL_COMMUNITY): Payer: Self-pay | Admitting: Anesthesiology

## 2012-03-15 ENCOUNTER — Encounter (HOSPITAL_COMMUNITY)
Admission: RE | Disposition: A | Discharge status: Home / Self Care | Payer: Self-pay | Source: Ambulatory Visit | Attending: Orthopedic Surgery

## 2012-03-15 ENCOUNTER — Ambulatory Visit (HOSPITAL_COMMUNITY): Payer: Medicare Other | Admitting: Anesthesiology

## 2012-03-15 ENCOUNTER — Encounter (HOSPITAL_COMMUNITY): Payer: Self-pay | Admitting: *Deleted

## 2012-03-15 ENCOUNTER — Observation Stay (HOSPITAL_COMMUNITY)
Admission: RE | Admit: 2012-03-15 | Discharge: 2012-03-16 | Disposition: A | Discharge status: Home / Self Care | Payer: Medicare Other | Source: Ambulatory Visit | Attending: Orthopedic Surgery | Admitting: Orthopedic Surgery

## 2012-03-15 DIAGNOSIS — D649 Anemia, unspecified: Secondary | ICD-10-CM | POA: Insufficient documentation

## 2012-03-15 DIAGNOSIS — Z87891 Personal history of nicotine dependence: Secondary | ICD-10-CM | POA: Insufficient documentation

## 2012-03-15 DIAGNOSIS — Z79899 Other long term (current) drug therapy: Secondary | ICD-10-CM | POA: Insufficient documentation

## 2012-03-15 DIAGNOSIS — S52599A Other fractures of lower end of unspecified radius, initial encounter for closed fracture: Principal | ICD-10-CM | POA: Insufficient documentation

## 2012-03-15 DIAGNOSIS — Y9229 Other specified public building as the place of occurrence of the external cause: Secondary | ICD-10-CM | POA: Insufficient documentation

## 2012-03-15 DIAGNOSIS — I10 Essential (primary) hypertension: Secondary | ICD-10-CM | POA: Insufficient documentation

## 2012-03-15 DIAGNOSIS — D689 Coagulation defect, unspecified: Secondary | ICD-10-CM | POA: Insufficient documentation

## 2012-03-15 DIAGNOSIS — W010XXA Fall on same level from slipping, tripping and stumbling without subsequent striking against object, initial encounter: Secondary | ICD-10-CM | POA: Insufficient documentation

## 2012-03-15 DIAGNOSIS — M810 Age-related osteoporosis without current pathological fracture: Secondary | ICD-10-CM | POA: Insufficient documentation

## 2012-03-15 HISTORY — DX: Nausea with vomiting, unspecified: R11.2

## 2012-03-15 HISTORY — PX: ORIF WRIST FRACTURE: SHX2133

## 2012-03-15 HISTORY — DX: Shortness of breath: R06.02

## 2012-03-15 HISTORY — DX: Other specified postprocedural states: Z98.890

## 2012-03-15 LAB — BASIC METABOLIC PANEL
CO2: 24 mEq/L (ref 19–32)
Calcium: 9.2 mg/dL (ref 8.4–10.5)
Chloride: 98 mEq/L (ref 96–112)
Sodium: 134 mEq/L — ABNORMAL LOW (ref 135–145)

## 2012-03-15 LAB — SURGICAL PCR SCREEN: Staphylococcus aureus: POSITIVE — AB

## 2012-03-15 LAB — CBC
MCV: 91.6 fL (ref 78.0–100.0)
Platelets: 165 10*3/uL (ref 150–400)
RBC: 4.28 MIL/uL (ref 3.87–5.11)
WBC: 6.9 10*3/uL (ref 4.0–10.5)

## 2012-03-15 SURGERY — OPEN REDUCTION INTERNAL FIXATION (ORIF) WRIST FRACTURE
Anesthesia: General | Site: Wrist | Laterality: Left | Wound class: Clean

## 2012-03-15 MED ORDER — CHLORHEXIDINE GLUCONATE 4 % EX LIQD: 60.0000 mL | Freq: Once | CUTANEOUS | Status: DC

## 2012-03-15 MED ORDER — VANCOMYCIN HCL IN DEXTROSE 1-5 GM/200ML-% IV SOLN
1000.0000 mg | Freq: Two times a day (BID) | INTRAVENOUS | Status: AC
  Administered 2012-03-15 – 2012-03-16 (×2): 1000 mg via INTRAVENOUS
  Filled 2012-03-15 (×2): qty 200

## 2012-03-15 MED ORDER — MUPIROCIN 2 % EX OINT
TOPICAL_OINTMENT | Freq: Two times a day (BID) | CUTANEOUS | Status: DC
  Administered 2012-03-15: 1 via NASAL
  Administered 2012-03-15 – 2012-03-16 (×2): via NASAL
  Filled 2012-03-15: qty 22

## 2012-03-15 MED ORDER — ONDANSETRON HCL 4 MG PO TABS: 4.0000 mg | ORAL_TABLET | Freq: Four times a day (QID) | ORAL | Status: DC | PRN

## 2012-03-15 MED ORDER — ATENOLOL 50 MG PO TABS
50.0000 mg | ORAL_TABLET | Freq: Every day | ORAL | Status: DC
  Administered 2012-03-16: 50 mg via ORAL
  Filled 2012-03-15: qty 1

## 2012-03-15 MED ORDER — METHOCARBAMOL 500 MG PO TABS: 500.0000 mg | ORAL_TABLET | Freq: Four times a day (QID) | ORAL | Status: DC | PRN

## 2012-03-15 MED ORDER — BUPIVACAINE-EPINEPHRINE PF 0.5-1:200000 % IJ SOLN
INTRAMUSCULAR | Status: DC | PRN
  Administered 2012-03-15: 20 mL

## 2012-03-15 MED ORDER — OXYCODONE HCL 5 MG PO TABS: 5.0000 mg | ORAL_TABLET | ORAL | Status: DC | PRN

## 2012-03-15 MED ORDER — VITAMIN D3 25 MCG (1000 UNIT) PO TABS
3000.0000 [IU] | ORAL_TABLET | Freq: Every day | ORAL | Status: DC
  Administered 2012-03-15 – 2012-03-16 (×2): 3000 [IU] via ORAL
  Filled 2012-03-15 (×2): qty 3

## 2012-03-15 MED ORDER — PROPOFOL 10 MG/ML IV BOLUS
INTRAVENOUS | Status: DC | PRN
  Administered 2012-03-15: 140 mg via INTRAVENOUS

## 2012-03-15 MED ORDER — 0.9 % SODIUM CHLORIDE (POUR BTL) OPTIME
TOPICAL | Status: DC | PRN
  Administered 2012-03-15: 1000 mL

## 2012-03-15 MED ORDER — SENNA 8.6 MG PO TABS
1.0000 | ORAL_TABLET | Freq: Two times a day (BID) | ORAL | Status: DC
  Administered 2012-03-15 – 2012-03-16 (×3): 8.6 mg via ORAL
  Filled 2012-03-15 (×4): qty 1

## 2012-03-15 MED ORDER — FENTANYL CITRATE 0.05 MG/ML IJ SOLN: 50.0000 ug | Freq: Once | INTRAMUSCULAR | Status: DC

## 2012-03-15 MED ORDER — ONDANSETRON HCL 4 MG/2ML IJ SOLN
4.0000 mg | Freq: Four times a day (QID) | INTRAMUSCULAR | Status: DC | PRN
  Administered 2012-03-16: 4 mg via INTRAVENOUS
  Filled 2012-03-15: qty 2

## 2012-03-15 MED ORDER — FAMOTIDINE 20 MG PO TABS
20.0000 mg | ORAL_TABLET | Freq: Two times a day (BID) | ORAL | Status: DC | PRN
  Filled 2012-03-15: qty 1

## 2012-03-15 MED ORDER — VITAMIN D 50 MCG (2000 UT) PO CAPS: 3.0000 | ORAL_CAPSULE | Freq: Every day | ORAL | Status: DC

## 2012-03-15 MED ORDER — PROMETHAZINE HCL 25 MG/ML IJ SOLN: 6.2500 mg | INTRAMUSCULAR | Status: DC | PRN

## 2012-03-15 MED ORDER — ARTIFICIAL TEARS OP OINT
TOPICAL_OINTMENT | OPHTHALMIC | Status: DC | PRN
  Administered 2012-03-15: 1 via OPHTHALMIC

## 2012-03-15 MED ORDER — SODIUM CHLORIDE 0.45 % IV SOLN: INTRAVENOUS | Status: DC

## 2012-03-15 MED ORDER — CALCIUM CARBONATE 1250 (500 CA) MG PO TABS
1.0000 | ORAL_TABLET | Freq: Every day | ORAL | Status: DC
  Administered 2012-03-15 – 2012-03-16 (×2): 500 mg via ORAL
  Filled 2012-03-15 (×2): qty 1

## 2012-03-15 MED ORDER — LACTATED RINGERS IV SOLN
INTRAVENOUS | Status: DC
  Administered 2012-03-16: 06:00:00 via INTRAVENOUS

## 2012-03-15 MED ORDER — DEXAMETHASONE SODIUM PHOSPHATE 4 MG/ML IJ SOLN
INTRAMUSCULAR | Status: DC | PRN
  Administered 2012-03-15: 4 mg via INTRAVENOUS
  Administered 2012-03-15: 4 mg

## 2012-03-15 MED ORDER — MIDAZOLAM HCL 5 MG/5ML IJ SOLN
INTRAMUSCULAR | Status: DC | PRN
  Administered 2012-03-15 (×2): 1 mg via INTRAVENOUS

## 2012-03-15 MED ORDER — MORPHINE SULFATE 2 MG/ML IJ SOLN: 1.0000 mg | INTRAMUSCULAR | Status: DC | PRN

## 2012-03-15 MED ORDER — MUPIROCIN 2 % EX OINT
TOPICAL_OINTMENT | CUTANEOUS | Status: AC
  Administered 2012-03-15: 1 via NASAL
  Filled 2012-03-15: qty 22

## 2012-03-15 MED ORDER — MIDAZOLAM HCL 2 MG/2ML IJ SOLN: 1.0000 mg | INTRAMUSCULAR | Status: DC | PRN

## 2012-03-15 MED ORDER — LACTATED RINGERS IV SOLN
INTRAVENOUS | Status: DC | PRN
  Administered 2012-03-15: 08:00:00 via INTRAVENOUS

## 2012-03-15 MED ORDER — METHOCARBAMOL 100 MG/ML IJ SOLN
500.0000 mg | Freq: Four times a day (QID) | INTRAVENOUS | Status: DC | PRN
  Filled 2012-03-15: qty 5

## 2012-03-15 MED ORDER — PHENYLEPHRINE HCL 10 MG/ML IJ SOLN
INTRAMUSCULAR | Status: DC | PRN
  Administered 2012-03-15 (×3): 80 ug via INTRAVENOUS
  Administered 2012-03-15: 40 ug via INTRAVENOUS
  Administered 2012-03-15: 80 ug via INTRAVENOUS

## 2012-03-15 MED ORDER — LIDOCAINE HCL (CARDIAC) 20 MG/ML IV SOLN
INTRAVENOUS | Status: DC | PRN
  Administered 2012-03-15: 40 mg via INTRAVENOUS

## 2012-03-15 MED ORDER — ONDANSETRON HCL 4 MG/2ML IJ SOLN
INTRAMUSCULAR | Status: DC | PRN
  Administered 2012-03-15: 4 mg via INTRAVENOUS

## 2012-03-15 MED ORDER — CALCIUM 500 MG PO CHEW: 3.0000 | CHEWABLE_TABLET | Freq: Every day | ORAL | Status: DC

## 2012-03-15 MED ORDER — OXYCODONE HCL 5 MG PO TABS: 5.0000 mg | ORAL_TABLET | Freq: Once | ORAL | Status: AC | PRN

## 2012-03-15 MED ORDER — FENTANYL CITRATE 0.05 MG/ML IJ SOLN: 25.0000 ug | INTRAMUSCULAR | Status: DC | PRN

## 2012-03-15 MED ORDER — TEMAZEPAM 15 MG PO CAPS: 15.0000 mg | ORAL_CAPSULE | Freq: Every evening | ORAL | Status: DC | PRN

## 2012-03-15 MED ORDER — VITAMIN C 500 MG PO TABS
1000.0000 mg | ORAL_TABLET | Freq: Every day | ORAL | Status: DC
  Administered 2012-03-15 – 2012-03-16 (×2): 1000 mg via ORAL
  Filled 2012-03-15 (×2): qty 2

## 2012-03-15 MED ORDER — ALPRAZOLAM 0.5 MG PO TABS: 0.5000 mg | ORAL_TABLET | Freq: Four times a day (QID) | ORAL | Status: DC | PRN

## 2012-03-15 MED ORDER — FENTANYL CITRATE 0.05 MG/ML IJ SOLN
INTRAMUSCULAR | Status: DC | PRN
  Administered 2012-03-15 (×2): 50 ug via INTRAVENOUS

## 2012-03-15 MED ORDER — OXYCODONE HCL 5 MG/5ML PO SOLN: 5.0000 mg | Freq: Once | ORAL | Status: AC | PRN

## 2012-03-15 SURGICAL SUPPLY — 64 items
BANDAGE ELASTIC 3 VELCRO ST LF (GAUZE/BANDAGES/DRESSINGS) ×2 IMPLANT
BANDAGE ELASTIC 4 VELCRO ST LF (GAUZE/BANDAGES/DRESSINGS) ×2 IMPLANT
BANDAGE GAUZE ELAST BULKY 4 IN (GAUZE/BANDAGES/DRESSINGS) ×2 IMPLANT
BIT DRILL 2 FAST STEP (BIT) ×1 IMPLANT
BIT DRILL 2.5X4 QC (BIT) ×1 IMPLANT
BLADE SURG ROTATE 9660 (MISCELLANEOUS) IMPLANT
BNDG CMPR 9X4 STRL LF SNTH (GAUZE/BANDAGES/DRESSINGS) ×1
BNDG ESMARK 4X9 LF (GAUZE/BANDAGES/DRESSINGS) ×2 IMPLANT
CLOTH BEACON ORANGE TIMEOUT ST (SAFETY) ×2 IMPLANT
CORDS BIPOLAR (ELECTRODE) ×2 IMPLANT
COVER SURGICAL LIGHT HANDLE (MISCELLANEOUS) ×2 IMPLANT
CUFF TOURNIQUET SINGLE 18IN (TOURNIQUET CUFF) ×2 IMPLANT
CUFF TOURNIQUET SINGLE 24IN (TOURNIQUET CUFF) IMPLANT
DECANTER SPIKE VIAL GLASS SM (MISCELLANEOUS) IMPLANT
DRAIN TLS ROUND 10FR (DRAIN) IMPLANT
DRAPE INCISE IOBAN 66X45 STRL (DRAPES) IMPLANT
DRAPE OEC MINIVIEW 54X84 (DRAPES) ×1 IMPLANT
DRAPE U-SHAPE 47X51 STRL (DRAPES) ×1 IMPLANT
DRSG ADAPTIC 3X8 NADH LF (GAUZE/BANDAGES/DRESSINGS) ×1 IMPLANT
ELECT REM PT RETURN 9FT ADLT (ELECTROSURGICAL)
ELECTRODE REM PT RTRN 9FT ADLT (ELECTROSURGICAL) IMPLANT
GAUZE XEROFORM 1X8 LF (GAUZE/BANDAGES/DRESSINGS) ×2 IMPLANT
GLOVE BIOGEL M STRL SZ7.5 (GLOVE) ×6 IMPLANT
GLOVE SS BIOGEL STRL SZ 8 (GLOVE) ×1 IMPLANT
GLOVE SUPERSENSE BIOGEL SZ 8 (GLOVE) ×1
GOWN PREVENTION PLUS XLARGE (GOWN DISPOSABLE) ×2 IMPLANT
GOWN STRL NON-REIN LRG LVL3 (GOWN DISPOSABLE) ×6 IMPLANT
GOWN STRL REIN XL XLG (GOWN DISPOSABLE) ×4 IMPLANT
KIT BASIN OR (CUSTOM PROCEDURE TRAY) ×2 IMPLANT
KIT ROOM TURNOVER OR (KITS) ×2 IMPLANT
LOOP VESSEL MAXI BLUE (MISCELLANEOUS) ×1 IMPLANT
MANIFOLD NEPTUNE II (INSTRUMENTS) ×2 IMPLANT
NDL BLUNT 16X1.5 OR ONLY (NEEDLE) IMPLANT
NEEDLE 22X1 1/2 (OR ONLY) (NEEDLE) IMPLANT
NEEDLE BLUNT 16X1.5 OR ONLY (NEEDLE) IMPLANT
NS IRRIG 1000ML POUR BTL (IV SOLUTION) IMPLANT
PACK ORTHO EXTREMITY (CUSTOM PROCEDURE TRAY) ×2 IMPLANT
PAD ARMBOARD 7.5X6 YLW CONV (MISCELLANEOUS) ×4 IMPLANT
PAD CAST 3X4 CTTN HI CHSV (CAST SUPPLIES) IMPLANT
PAD CAST 4YDX4 CTTN HI CHSV (CAST SUPPLIES) ×1 IMPLANT
PADDING CAST COTTON 3X4 STRL (CAST SUPPLIES) ×2
PADDING CAST COTTON 4X4 STRL (CAST SUPPLIES) ×2
PEG SUBCHONDRAL SMOOTH 2.0X18 (Peg) ×4 IMPLANT
PEG SUBCHONDRAL SMOOTH 2.0X20 (Peg) ×6 IMPLANT
PLATE STAN 24.4X59.5 LT (Plate) ×1 IMPLANT
SCREW BN 12X3.5XNS CORT TI (Screw) IMPLANT
SCREW BN 13X3.5XNS CORT TI (Screw) IMPLANT
SCREW CORT 3.5X12 (Screw) ×4 IMPLANT
SCREW CORT 3.5X13 (Screw) ×4 IMPLANT
SPONGE GAUZE 4X4 12PLY (GAUZE/BANDAGES/DRESSINGS) ×2 IMPLANT
SPONGE LAP 4X18 X RAY DECT (DISPOSABLE) ×1 IMPLANT
STAPLER VISISTAT 35W (STAPLE) IMPLANT
SUCTION FRAZIER TIP 10 FR DISP (SUCTIONS) ×1 IMPLANT
SUT ETHILON 4 0 PS 2 18 (SUTURE) IMPLANT
SUT PROLENE 4 0 PS 2 18 (SUTURE) ×3 IMPLANT
SUT VIC AB 3-0 FS2 27 (SUTURE) ×1 IMPLANT
SYR CONTROL 10ML LL (SYRINGE) IMPLANT
SYSTEM CHEST DRAIN TLS 7FR (DRAIN) IMPLANT
TOWEL OR 17X24 6PK STRL BLUE (TOWEL DISPOSABLE) ×2 IMPLANT
TOWEL OR 17X26 10 PK STRL BLUE (TOWEL DISPOSABLE) ×2 IMPLANT
TUBE CONNECTING 12X1/4 (SUCTIONS) ×2 IMPLANT
TUBE EVACUATION TLS (MISCELLANEOUS) ×2 IMPLANT
WATER STERILE IRR 1000ML POUR (IV SOLUTION) ×2 IMPLANT
YANKAUER SUCT BULB TIP NO VENT (SUCTIONS) IMPLANT

## 2012-03-15 NOTE — Evaluation (Signed)
Physical Therapy Evaluation Patient Details Name: Chloe Watkins MRN: [id number] DOB: 1935/11/09 Today's Date: 03/15/2012 Time: 7829-5621 PT Time Calculation (min): 32 min  PT Assessment / Plan / Recommendation Clinical Impression  Pt is a very impulsive 76 y/o female s/p ORIF for L wrist fx.  Pt lives alone with no consistent care giver.  Pt reports her nephew lives nearby and can come over every day.  During todays evaluation pt had no neuromuscular control over her L UE from C4-C8.  Placed L UE in arm sling for today's session. No Order for sling located in chart.  Pt willl be followed by PT in acute setting to maximize safety with mobility prior to pt return to home. No Pt follow up suggested at this time.       PT Assessment  Patient needs continued PT services    Follow Up Recommendations  No PT follow up    Barriers to Discharge Decreased caregiver support See clinical impression statement.     Equipment Recommendations  None recommended by PT    Recommendations for Other Services     Frequency Min 3X/week    Precautions / Restrictions Precautions Precautions: Fall Precaution Comments: Pt's injuries result of fall.  Fall caused by pt slipping on wet surface.   Required Braces or Orthoses: Other Brace/Splint Other Brace/Splint: Splint on L wrist and forearm. Restrictions Weight Bearing Restrictions: Yes LUE Weight Bearing: Non weight bearing   Pertinent Vitals/Pain No c/o pain.  L UE numb and flaccid from nerve block.       Mobility  Bed Mobility Bed Mobility: Supine to Sit;Sit to Supine Supine to Sit: 4: Min guard Sit to Supine: 7: Independent Details for Bed Mobility Assistance: Pt unable to sit up from supine secondary to history of back pain and inability to utilize L UE secondary to nerve block.  Transfers Transfers: Sit to Stand;Stand to Sit Sit to Stand: 5: Supervision;From bed;Without upper extremity assist;From toilet Stand to Sit: 5: Supervision;To  chair/3-in-1 Details for Transfer Assistance: Supervision for safety secondary to pt impulsive and moving prior to PT instruction.  Ambulation/Gait Ambulation/Gait Assistance: 5: Supervision Ambulation Distance (Feet): 200 Feet Assistive device: None Ambulation/Gait Assistance Details: Pt ambulation is very fast and sometimes appears to be out of control.  Educated pt that having her L UE in a sling could throw off her balance and that she should "take her time Gait Pattern: Within Functional Limits Stairs: No Wheelchair Mobility Wheelchair Mobility: No    Shoulder Instructions     Exercises Total Joint Exercises Ankle Circles/Pumps: Both;20 reps;Seated   PT Diagnosis: Other (comment)  PT Problem List: Decreased safety awareness;Decreased mobility;Decreased knowledge of precautions PT Treatment Interventions: Neuromuscular re-education;Functional mobility training;Patient/family education   PT Goals Acute Rehab PT Goals PT Goal Formulation: With patient Time For Goal Achievement: 03/22/12 Potential to Achieve Goals: Good Pt will go Supine/Side to Sit: with modified independence;with HOB 0 degrees PT Goal: Supine/Side to Sit - Progress: Goal set today Pt will go Sit to Supine/Side: with modified independence;with HOB 0 degrees PT Goal: Sit to Supine/Side - Progress: Goal set today Additional Goals Additional Goal #1: Pt will demonstrate safety with mobility without rushing for decreased falls risk.  PT Goal: Additional Goal #1 - Progress: Goal set today  Visit Information  Last PT Received On: 03/15/12 Assistance Needed: +1    Subjective Data  Subjective: I cant slow down.   Patient Stated Goal: return to home alone independent.    Prior Functioning  Home Living Lives With: Alone Available Help at Discharge: Family;Available PRN/intermittently (Nephew lives nearby) Type of Home: House Home Access: Level entry Home Layout: One level Bathroom Shower/Tub: Tub/shower  unit;Tub only;Walk-in shower Bathroom Toilet: Standard Bathroom Accessibility: Yes Home Adaptive Equipment: None Prior Function Level of Independence: Independent Able to Take Stairs?: Yes Driving: Yes Vocation: Retired Musician: No difficulties Dominant Hand: Right    Cognition  Overall Cognitive Status: Appears within functional limits for tasks assessed/performed Arousal/Alertness: Awake/alert Orientation Level: Appears intact for tasks assessed Behavior During Session: [facility] for tasks performed    Extremity/Trunk Assessment Right Upper Extremity Assessment RUE ROM/Strength/Tone: Within functional levels Left Upper Extremity Assessment LUE ROM/Strength/Tone: Unable to fully assess;Deficits LUE ROM/Strength/Tone Deficits: L UE flaccid secondary to nerve block for surgery.  Right Lower Extremity Assessment RLE ROM/Strength/Tone: Within functional levels Left Lower Extremity Assessment LLE ROM/Strength/Tone: Within functional levels   Balance Balance Balance Assessed: Yes Dynamic Standing Balance Dynamic Standing - Balance Activities: Forward lean/weight shifting;Reaching for objects;Reaching across midline Dynamic Standing - Comments: No LOB noted. High Level Balance High Level Balance Activites: Side stepping;Backward walking;Direction changes;Turns High Level Balance Comments: Pt a little unsteady but no significant LOB.   End of Session PT - End of Session Equipment Utilized During Treatment: Gait belt;Other (comment) (Arm sling) Activity Tolerance: Patient tolerated treatment well Patient left: in chair;with call bell/phone within reach Nurse Communication: Mobility status  GP     Daxten Kovalenko 03/15/2012, 5:31 PM  Jozi Malachi L. Yuri Fana DPT [phone]

## 2012-03-15 NOTE — Anesthesia Procedure Notes (Addendum)
Anesthesia Regional Block:  Supraclavicular block  Pre-Anesthetic Checklist: ,, timeout performed, Correct Patient, Correct Site, Correct Laterality, Correct Procedure, Correct Position, site marked, Risks and benefits discussed,  Surgical consent,  Pre-op evaluation,  At surgeon's request and post-op pain management  Laterality: Left  Prep: chloraprep       Needles:  Injection technique: Single-shot  Needle Type: Echogenic Needle     Needle Length: 5cm 5 cm Needle Gauge: 22 and 22 G    Additional Needles:  Procedures: ultrasound guided (picture in chart) and nerve stimulator Supraclavicular block  Nerve Stimulator or Paresthesia:  Response: 0.48 mA,   Additional Responses:   Narrative:  Start time: 03/15/2012 7:45 AM End time: 03/15/2012 7:56 AM Injection made incrementally with aspirations every 3 mL. Anesthesiologist: Dr Gypsy Balsam  Additional Notes: 1610-9604 L Supraclav block POP CHG prep, sterile tech #22 stim/echo needle with good [place] visualization-pix in chart- and stim down to .48ma Multiple neg asp Marc .5% w/epi 20cc + decadron 4mg  infiltrated No air/art blood or other compl Dr Gypsy Balsam   Performed by: Jefm Miles E    Procedure Name: LMA Insertion Date/Time: 03/15/2012 8:24 AM Performed by: Jefm Miles E Pre-anesthesia Checklist: Patient identified, Timeout performed, Emergency Drugs available, Suction available and Patient being monitored Patient Re-evaluated:Patient Re-evaluated prior to inductionOxygen Delivery Method: Circle system utilized Preoxygenation: Pre-oxygenation with 100% oxygen Intubation Type: IV induction LMA: LMA inserted LMA Size: 4.0 Number of attempts: 1 Placement Confirmation: positive ETCO2 and breath sounds checked- equal and bilateral Tube secured with: Tape Dental Injury: Teeth and Oropharynx as per pre-operative assessment

## 2012-03-15 NOTE — Anesthesia Postprocedure Evaluation (Signed)
  Anesthesia Post-op Note  Patient: Chloe Watkins  Procedure(s) Performed: Procedure(s) (LRB) with comments: OPEN REDUCTION INTERNAL FIXATION (ORIF) WRIST FRACTURE (Left)  Patient Location: PACU  Anesthesia Type:GA combined with regional for post-op pain  Level of Consciousness: awake  Airway and Oxygen Therapy: Patient Spontanous Breathing  Post-op Pain: none  Post-op Assessment: Post-op Vital signs reviewed, Patient's Cardiovascular Status Stable, Respiratory Function Stable, Patent Airway, No signs of Nausea or vomiting and Pain level controlled  Post-op Vital Signs: stable  Complications: No apparent anesthesia complications

## 2012-03-15 NOTE — Transfer of Care (Signed)
Immediate Anesthesia Transfer of Care Note  Patient: Chloe Watkins  Procedure(s) Performed: Procedure(s) (LRB) with comments: OPEN REDUCTION INTERNAL FIXATION (ORIF) WRIST FRACTURE (Left)  Patient Location: PACU  Anesthesia Type:General  Level of Consciousness: awake, alert  and oriented  Airway & Oxygen Therapy: Patient Spontanous Breathing and Patient connected to nasal cannula oxygen  Post-op Assessment: Report given to PACU RN  Post vital signs: Reviewed and stable  Complications: No apparent anesthesia complications

## 2012-03-15 NOTE — H&P (Signed)
Chloe Watkins is an 76 y.o. female.   Chief Complaint: presents for ORIF left wrist fracture HPI: Marland KitchenMarland KitchenPatient presents for evaluation and treatment of the of their upper extremity predicament. The patient denies neck back chest or of abdominal pain. The patient notes that they have no lower extremity problems. The patient from primarily complains of the upper extremity pain noted. Understands the risk and benefits of surgery.  Will proceed  Past Medical History  Diagnosis Date  . Anemia   . Clotting disorder     hx low platelets  . Hypertension   . Osteoporosis   . Thyroid disease   . Chills   . Hearing loss   . Leg swelling   . Diarrhea   . Arthritis pain   . Headache   . Bruises easily   . Breast cancer 04/21/2011    INVASIVE DUCTAL CA  . Arthritis   . PONV (postoperative nausea and vomiting)     did ok with last surgery 05/2011  . Shortness of breath     with exertion    Past Surgical History  Procedure Date  . Vein ligation and stripping 1960  . Uterine fibroid surgery 1975  . Ankle surgery 2000  . Wrist surgery 2000    right  . Eye surgery     lt cataract  . Breast lumpectomy 05/25/11    L breast, node bx: inv ductal, dcis, 1/2 nodes pos, ER/PR +, HER 2 -  . Cholecystectomy 1970    History reviewed. No pertinent family history. Social History:  reports that she quit smoking about 30 years ago. She has never used smokeless tobacco. She reports that she does not drink alcohol or use illicit drugs.  Allergies:  Allergies  Allergen Reactions  . Hydrocodone Nausea And Vomiting  . Penicillins Itching and Swelling    Medications Prior to Admission  Medication Sig Dispense Refill  . acetaminophen (TYLENOL) 500 MG tablet Take 500 mg by mouth every 6 (six) hours as needed. Pain      . Ascorbic Acid (VITAMIN C) 1000 MG tablet Take 1,000 mg by mouth 2 (two) times daily.      Marland Kitchen atenolol (TENORMIN) 50 MG tablet Take 50 mg by mouth daily.       . Calcium 500 MG CHEW Chew 3  tablets by mouth daily.      . Cholecalciferol (VITAMIN D) 2000 UNITS CAPS Take 3 capsules by mouth daily.      Marland Kitchen HYDROcodone-acetaminophen (NORCO) 5-325 MG per tablet Take 1-2 tablets by mouth every 6 (six) hours as needed for pain.  20 tablet  0  . letrozole (FEMARA) 2.5 MG tablet Take 2.5 mg by mouth daily.      . MULTIPLE VITAMINS PO Take 1 tablet by mouth daily.         Results for orders placed during the hospital encounter of 03/15/12 (from the past 48 hour(s))  CBC     Status: Normal   Collection Time   03/15/12  6:35 AM      Component Value Range Comment   WBC 6.9  4.0 - 10.5 K/uL    RBC 4.28  3.87 - 5.11 MIL/uL    Hemoglobin 12.8  12.0 - 15.0 g/dL    HCT 16.1  09.6 - 04.5 %    MCV 91.6  78.0 - 100.0 fL    MCH 29.9  26.0 - 34.0 pg    MCHC 32.7  30.0 - 36.0 g/dL  RDW 13.4  11.5 - 15.5 %    Platelets 165  150 - 400 K/uL    No results found.  Review of Systems  Constitutional: Negative.   HENT: Negative.   Eyes: Negative.   Cardiovascular: Negative.   Gastrointestinal: Negative.   Genitourinary: Negative.   Skin: Negative.   Psychiatric/Behavioral: Negative.     Blood pressure 121/72, pulse 90, temperature 98.3 F (36.8 C), temperature source Oral, resp. rate 18, height 5\' 5"  (1.651 m), weight 74.844 kg (165 lb), SpO2 94.00%. Physical Exam ..The patient is alert and oriented in no acute distress the patient complains of pain in the affected upper extremity. The patient is noted to have a normal HEENT exam. Lung fields show equal chest expansion and no shortness of breath abdomen exam is nontender without distention. Lower extremity examination does not show any fracture dislocation or blood clot symptoms. Pelvis is stable neck and back are stable and nontender  Left radius - volar barton's fracture  Closed with normal sensation and refill  Assessment/Plan .Marland KitchenWe are planning surgery for your upper extremity. The risk and benefits of surgery include risk of bleeding  infection anesthesia damage to normal structures and failure of the surgery to accomplish its intended goals of relieving symptoms and restoring function with this in mind we'll going to proceed. I have specifically discussed with the patient the pre-and postoperative regime and the does and don'ts and risk and benefits in great detail. Risk and benefits of surgery also include risk of dystrophy chronic nerve pain failure of the healing process to go onto completion and other inherent risks of surgery The relavent the pathophysiology of the disease/injury process, as well as the alternatives for treatment and postoperative course of action has been discussed in great detail with the patient who desires to proceed.  We will do everything in our power to help you (the patient) restore function to the upper extremity. Is a pleasure to see this patient today.   Sage Hammill III,Marvelene Stoneberg M 03/15/2012, 8:02 AM

## 2012-03-15 NOTE — Op Note (Signed)
See Dictation # [id number] Dominica Severin MD

## 2012-03-15 NOTE — Preoperative (Signed)
Beta Blockers   Reason not to administer Beta Blockers:Not Applicable, last dose 03/15/12 at 04:30

## 2012-03-15 NOTE — Progress Notes (Signed)
ANTIBIOTIC CONSULT NOTE - INITIAL  Pharmacy Consult for Vancomycin Indication: surgical prophylaxis   Allergies  Allergen Reactions  . Hydrocodone Nausea And Vomiting  . Penicillins Itching and Swelling    Patient Measurements: Height: 5\' 5"  (165.1 cm) Weight: 165 lb (74.844 kg) IBW/kg (Calculated) : 57   Vital Signs: Temp: 97.2 F (36.2 C) (11/08 1215) Temp src: Oral (11/08 0604) BP: 160/80 mmHg (11/08 1203) Pulse Rate: 68  (11/08 1215) Intake/Output from previous day:   Intake/Output from this shift: Total I/O In: 920 [P.O.:20; I.V.:900] Out: 20 [Blood:20]  Labs:  [facility] 03/15/12 0635  WBC 6.9  HGB 12.8  PLT 165  LABCREA --  CREATININE 0.77   Estimated Creatinine Clearance: 62.4 ml/min (by C-G formula based on Cr of 0.77). No results found for this basename: VANCOTROUGH:2,VANCOPEAK:2,VANCORANDOM:2,GENTTROUGH:2,GENTPEAK:2,GENTRANDOM:2,TOBRATROUGH:2,TOBRAPEAK:2,TOBRARND:2,AMIKACINPEAK:2,AMIKACINTROU:2,AMIKACIN:2, in the last 72 hours   Microbiology: Recent Results (from the past 720 hour(s))  SURGICAL PCR SCREEN     Status: Abnormal   Collection Time   03/15/12  6:32 AM      Component Value Range Status Comment   MRSA, PCR NEGATIVE  NEGATIVE Final    Staphylococcus aureus POSITIVE (*) NEGATIVE Final     Medical History: Past Medical History  Diagnosis Date  . Anemia   . Clotting disorder     hx low platelets  . Hypertension   . Osteoporosis   . Thyroid disease   . Chills   . Hearing loss   . Leg swelling   . Diarrhea   . Arthritis pain   . Headache   . Bruises easily   . Breast cancer 04/21/2011    INVASIVE DUCTAL CA  . Arthritis   . PONV (postoperative nausea and vomiting)     did ok with last surgery 05/2011  . Shortness of breath     with exertion   Assessment: 59 yof s/p ORIF left wrist fracture to get vancomycin surgical prophylaxis per pharmacy. Noted PCN allergy. Patient has good renal function with est crcl of 42ml/min. 1000mg   vancomycin given at ~0800 this morning. Pt is afebrile, wbc wnl, and MRSA pcr negative.   Plan:  Vancomycin 1000mg  IV q12 x 2 doses Pharmacy will sign off on protocol  Thank you,  Brett Fairy, PharmD 03/15/2012 1:29 PM

## 2012-03-15 NOTE — Anesthesia Preprocedure Evaluation (Addendum)
Anesthesia Evaluation  Patient identified by MRN, date of birth, ID band Patient awake    Reviewed: Allergy & Precautions, H&P , NPO status , Patient's Chart, lab work & pertinent test results, reviewed documented beta blocker date and time   Airway Mallampati: II TM Distance: >3 FB Neck ROM: full    Dental  (+) Partial Upper, Chipped and Dental Advisory Given,    Pulmonary neg pulmonary ROS, shortness of breath,  breath sounds clear to auscultation        Cardiovascular hypertension, On Medications and On Home Beta Blockers Rhythm:Regular Rate:Normal     Neuro/Psych  Headaches, negative psych ROS   GI/Hepatic negative GI ROS, Neg liver ROS,   Endo/Other  negative endocrine ROS  Renal/GU negative Renal ROS  negative genitourinary   Musculoskeletal   Abdominal   Peds  Hematology negative hematology ROS (+)   Anesthesia Other Findings See surgeon's H&P   Reproductive/Obstetrics negative OB ROS                          Anesthesia Physical Anesthesia Plan  ASA: II  Anesthesia Plan: General   Post-op Pain Management:    Induction: Intravenous  Airway Management Planned: LMA  Additional Equipment:   Intra-op Plan:   Post-operative Plan: Extubation in OR  Informed Consent: I have reviewed the patients History and Physical, chart, labs and discussed the procedure including the risks, benefits and alternatives for the proposed anesthesia with the patient or authorized representative who has indicated his/her understanding and acceptance.     Plan Discussed with: CRNA and Surgeon  Anesthesia Plan Comments:         Anesthesia Quick Evaluation

## 2012-03-15 NOTE — H&P (Signed)
Chloe Watkins is an 76 y.o. female.   Chief Complaint:  I broke my wrist HPI: The patient is a very pleasant 76 year old female who unfortunately sustained a left distal radius fracture. She was seen and evaluated in our office setting with findings consistent with a comminuted left distal radius fracture intra-articular in nature. Given the degree of displacement high propensity for further angulation and progressive collapse we discussed with the patient proceeding with surgical intervention as opposed to conservative measures.   Past Medical History  Diagnosis Date  . Anemia   . Clotting disorder     hx low platelets  . Hypertension   . Osteoporosis   . Thyroid disease   . Chills   . Hearing loss   . Leg swelling   . Diarrhea   . Arthritis pain   . Headache   . Bruises easily   . Breast cancer 04/21/2011    INVASIVE DUCTAL CA  . Arthritis   . PONV (postoperative nausea and vomiting)     did ok with last surgery 05/2011  . Shortness of breath     with exertion    Past Surgical History  Procedure Date  . Vein ligation and stripping 1960  . Uterine fibroid surgery 1975  . Ankle surgery 2000  . Wrist surgery 2000    right  . Eye surgery     lt cataract  . Breast lumpectomy 05/25/11    L breast, node bx: inv ductal, dcis, 1/2 nodes pos, ER/PR +, HER 2 -  . Cholecystectomy 1970    History reviewed. No pertinent family history. Social History:  reports that she quit smoking about 30 years ago. She has never used smokeless tobacco. She reports that she does not drink alcohol or use illicit drugs.  Allergies:  Allergies  Allergen Reactions  . Hydrocodone Nausea And Vomiting  . Penicillins Itching and Swelling    Medications Prior to Admission  Medication Sig Dispense Refill  . acetaminophen (TYLENOL) 500 MG tablet Take 500 mg by mouth every 6 (six) hours as needed. Pain      . Ascorbic Acid (VITAMIN C) 1000 MG tablet Take 1,000 mg by mouth 2 (two) times daily.      Marland Kitchen  atenolol (TENORMIN) 50 MG tablet Take 50 mg by mouth daily.       . Calcium 500 MG CHEW Chew 3 tablets by mouth daily.      . Cholecalciferol (VITAMIN D) 2000 UNITS CAPS Take 3 capsules by mouth daily.      Marland Kitchen HYDROcodone-acetaminophen (NORCO) 5-325 MG per tablet Take 1-2 tablets by mouth every 6 (six) hours as needed for pain.  20 tablet  0  . letrozole (FEMARA) 2.5 MG tablet Take 2.5 mg by mouth daily.      . MULTIPLE VITAMINS PO Take 1 tablet by mouth daily.         Results for orders placed during the hospital encounter of 03/15/12 (from the past 48 hour(s))  CBC     Status: Normal   Collection Time   03/15/12  6:35 AM      Component Value Range Comment   WBC 6.9  4.0 - 10.5 K/uL    RBC 4.28  3.87 - 5.11 MIL/uL    Hemoglobin 12.8  12.0 - 15.0 g/dL    HCT 16.1  09.6 - 04.5 %    MCV 91.6  78.0 - 100.0 fL    MCH 29.9  26.0 - 34.0 pg  MCHC 32.7  30.0 - 36.0 g/dL    RDW 95.6  21.3 - 08.6 %    Platelets 165  150 - 400 K/uL    No results found.  Review of Systems  Constitutional: Negative.   HENT: Negative.   Eyes: Negative.   Respiratory: Negative for cough, hemoptysis and wheezing.        The patient is noted to have exertional shortness of breath  Cardiovascular: Negative.   Gastrointestinal:       Patient is noted to have a history of postoperative nausea and vomiting and thus we'll take the precautions preoperatively and postoperative.  Musculoskeletal:       See history of present illness  Skin: Negative.   Neurological: Negative.   Endo/Heme/Allergies:       History of thrombocytopenia    Blood pressure 121/72, pulse 90, temperature 98.3 F (36.8 C), temperature source Oral, resp. rate 18, height 5\' 5"  (1.651 m), weight 74.844 kg (165 lb), SpO2 94.00%. Physical Exam ..Patient presents for evaluation and treatment of the of their upper extremity predicament. The patient denies neck back chest or of abdominal pain. The patient notes that they have no lower extremity  problems. The patient from primarily complains of the upper extremity pain noted. The patient is noted to have pain about the left upper extremity, she is neurovascularly intact. Radial, ulnar and median nerve are intact. She has mild swelling about the digits and dorsal hand. She has no signs of compartment syndrome. Elbow and shoulder are nontender.  Assessment/Plan .Marland KitchenWe are planning surgery for your upper extremity. The risk and benefits of surgery include risk of bleeding infection anesthesia damage to normal structures and failure of the surgery to accomplish its intended goals of relieving symptoms and restoring function with this in mind we'll going to proceed. I have specifically discussed with the patient the pre-and postoperative regime and the does and don'ts and risk and benefits in great detail. Risk and benefits of surgery also include risk of dystrophy chronic nerve pain failure of the healing process to go onto completion and other inherent risks of surgery The relavent the pathophysiology of the disease/injury process, as well as the alternatives for treatment and postoperative course of action has been discussed in great detail with the patient who desires to proceed.  We will do everything in our power to help you (the patient) restore function to the upper extremity. Is a pleasure to see this patient today.   Chloe Watkins L 03/15/2012, 7:47 AM

## 2012-03-16 MED ORDER — METHOCARBAMOL 500 MG PO TABS: 500.0000 mg | ORAL_TABLET | Freq: Four times a day (QID) | ORAL | Status: DC

## 2012-03-16 MED ORDER — HYDROCODONE-ACETAMINOPHEN 5-500 MG PO CAPS: 2.0000 | ORAL_CAPSULE | Freq: Four times a day (QID) | ORAL | Status: DC | PRN

## 2012-03-16 NOTE — Discharge Summary (Signed)
Physician Discharge Summary  Patient ID: Chloe Watkins MRN: [id number] DOB/AGE: Nov 19, 1935 76 y.o.  Admit date: 03/15/2012 Discharge date: 03/16/2012  Admission Diagnoses:SP ORIF Left wrist  Discharge Diagnoses: same Active Problems:  * No active hospital problems. *    Discharged Condition: good  Hospital Course: stable no complications .Marland KitchenThe patient is alert and oriented in no acute distress the patient complains of pain in the affected upper extremity. The patient is noted to have a normal HEENT exam. Lung fields show equal chest expansion and no shortness of breath abdomen exam is nontender without distention. Lower extremity examination does not show any fracture dislocation or blood clot symptoms. Pelvis is stable neck and back are stable and nontender  Consults:    Treatments: surgery: See op note  Discharge Exam: Blood pressure 136/64, pulse 66, temperature 98.2 F (36.8 C), temperature source Oral, resp. rate 18, height 5\' 5"  (1.651 m), weight 74.844 kg (165 lb), SpO2 96.00%. General appearance: alert, cooperative and appears stated age .Marland KitchenThe patient is alert and oriented in no acute distress the patient complains of pain in the affected upper extremity. The patient is noted to have a normal HEENT exam. Lung fields show equal chest expansion and no shortness of breath abdomen exam is nontender without distention. Lower extremity examination does not show any fracture dislocation or blood clot symptoms. Pelvis is stable neck and back are stable and nontender  Disposition: 01-Home or Self Care  Discharge Orders    Future Appointments: Provider: Department: Dept Phone: Center:   03/25/2012 1:50 PM Billie Lade, MD [facility] RADIATION ONCOLOGY [phone] None   03/26/2012 11:20 AM [name], MD [facility], [place] [phone] None   05/14/2012 8:30 AM Krista Blue [facility] MEDICAL ONCOLOGY [phone] None   05/21/2012  10:45 AM Amy Allegra Grana, PA [facility] MEDICAL ONCOLOGY [phone] None   01/20/2013 10:00 AM Radene Gunning [facility] MEDICAL ONCOLOGY [phone] None   01/27/2013 10:30 AM Lowella Dell, MD [facility] MEDICAL ONCOLOGY [phone] None       Medication List     As of 03/16/2012  8:59 AM    TAKE these medications         acetaminophen 500 MG tablet   Commonly known as: TYLENOL   Take 500 mg by mouth every 6 (six) hours as needed. Pain      atenolol 50 MG tablet   Commonly known as: TENORMIN   Take 50 mg by mouth daily.      Calcium 500 MG Chew   Chew 3 tablets by mouth daily.      HYDROcodone-acetaminophen 5-325 MG per tablet   Commonly known as: NORCO/VICODIN   Take 1-2 tablets by mouth every 6 (six) hours as needed for pain.      hydrocodone-acetaminophen 5-500 MG per capsule   Commonly known as: LORCET-HD   Take 2 capsules by mouth every 6 (six) hours as needed for pain.      letrozole 2.5 MG tablet   Commonly known as: FEMARA   Take 2.5 mg by mouth daily.      methocarbamol 500 MG tablet   Commonly known as: ROBAXIN   Take 1 tablet (500 mg total) by mouth 4 (four) times daily.      MULTIPLE VITAMINS PO   Take 1 tablet by mouth daily.      vitamin C 1000 MG tablet   Take 1,000  mg by mouth 2 (two) times daily.      Vitamin D 2000 UNITS Caps   Take 3 capsules by mouth daily.           Follow-up Information    Follow up with Karen Chafe, MD. (call [phone] to see Dr Amanda Pea in 10-14 days)    Contact information:   [address] [place] [address] [place] [address] [phone]          Signed: Karen Chafe 03/16/2012, 8:59 AM

## 2012-03-16 NOTE — Progress Notes (Signed)
Physical Therapy Treatment Patient Details Name: Chloe Watkins MRN: [id number] DOB: 1935-09-04 Today's Date: 03/16/2012 Time: 0715-0728 PT Time Calculation (min): 13 min  PT Assessment / Plan / Recommendation Comments on Treatment Session  Pt is restless. Pt ambulating in her room upon PT arrival.  Pt continues to ambulate quickly but this appears to be her baseline.      Follow Up Recommendations  No PT follow up     Does the patient have the potential to tolerate intense rehabilitation     Barriers to Discharge        Equipment Recommendations  None recommended by PT    Recommendations for Other Services    Frequency     Plan All goals met and education completed, patient dischaged from PT services    Precautions / Restrictions Precautions Precautions: Fall Precaution Comments: Pt's injuries result of fall.  Fall caused by pt slipping on wet surface.   Required Braces or Orthoses: Other Brace/Splint Other Brace/Splint: Splint on L wrist and forearm. Restrictions Weight Bearing Restrictions: Yes LUE Weight Bearing: Non weight bearing   Pertinent Vitals/Pain No c/o pain.    Mobility  Bed Mobility Bed Mobility: Not assessed Transfers Transfers: Not assessed Ambulation/Gait Ambulation/Gait Assistance: 6: Modified independent (Device/Increase time) Ambulation Distance (Feet): 200 Feet Assistive device: None Ambulation/Gait Assistance Details: Pt not as out of control this session but continues to walk very fast.  Gait Pattern: Within Functional Limits Wheelchair Mobility Wheelchair Mobility: No    Exercises     PT Diagnosis:    PT Problem List:   PT Treatment Interventions:     PT Goals Acute Rehab PT Goals PT Goal Formulation: With patient Time For Goal Achievement: 03/22/12 Potential to Achieve Goals: Good Pt will go Supine/Side to Sit: with modified independence;with HOB 0 degrees PT Goal: Supine/Side to Sit - Progress: Not met Pt will go Sit to  Supine/Side: with modified independence;with HOB 0 degrees PT Goal: Sit to Supine/Side - Progress: Not met Additional Goals Additional Goal #1: Pt will demonstrate safety with mobility without rushing for decreased falls risk.  PT Goal: Additional Goal #1 - Progress: Met  Visit Information  Last PT Received On: 03/16/12    Subjective Data  Subjective: I need to get moving Patient Stated Goal: return to home   Cognition  Overall Cognitive Status: Appears within functional limits for tasks assessed/performed Arousal/Alertness: Awake/alert Orientation Level: Appears intact for tasks assessed Behavior During Session: [facility] for tasks performed    Balance  Balance Balance Assessed: No  End of Session PT - End of Session Equipment Utilized During Treatment: Gait belt;Other (comment) Activity Tolerance: Patient tolerated treatment well Patient left: Other (comment) (sitting on EOB) Nurse Communication: Mobility status   GP     Bishoy Cupp 03/16/2012, 3:25 PM

## 2012-03-16 NOTE — Progress Notes (Signed)
Subjective: 1 Day Post-Op Procedure(s) (LRB): OPEN REDUCTION INTERNAL FIXATION (ORIF) WRIST FRACTURE (Left) Patient reports pain as mild.    Objective: Vital signs in last 24 hours: Temp:  [97.2 F (36.2 C)-98.5 F (36.9 C)] 98.2 F (36.8 C) (11/09 0530) Pulse Rate:  [63-70] 66  (11/09 0530) Resp:  [13-22] 18  (11/09 0530) BP: (109-160)/(54-84) 136/64 mmHg (11/09 0530) SpO2:  [93 %-100 %] 96 % (11/09 0530)  Intake/Output from previous day: 11/08 0701 - 11/09 0700 In: 2110 [P.O.:260; I.V.:1650; IV Piggyback:200] Out: 30 [Drains:10; Blood:20] Intake/Output this shift:     Basename 03/15/12 0635  HGB 12.8    Basename 03/15/12 0635  WBC 6.9  RBC 4.28  HCT 39.2  PLT 165    Basename 03/15/12 0635  NA 134*  K 3.7  CL 98  CO2 24  BUN 11  CREATININE 0.77  GLUCOSE 103*  CALCIUM 9.2   No results found for this basename: LABPT:2,INR:2 in the last 72 hours  Neurologically intact ABD soft Neurovascular intact Sensation intact distally Intact pulses distally Incision: dressing C/D/I No cellulitis present Compartment soft  Assessment/Plan: 1 Day Post-Op Procedure(s) (LRB): OPEN REDUCTION INTERNAL FIXATION (ORIF) WRIST FRACTURE (Left) Advance diet Plan DC today  Looks great Drain dced   Arty Lantzy III,Saadia Dewitt M 03/16/2012, 8:53 AM

## 2012-03-16 NOTE — Op Note (Signed)
NAMEMarland Kitchen  ARTELIA, GAME NO.:  [id number]  MEDICAL RECORD NO.:  [id number]  LOCATION:  5N31C                        FACILITY:  [facility]  PHYSICIAN:  Dionne Ano. Olufemi Mofield, M.D.DATE OF BIRTH:  12/12/1935  DATE OF PROCEDURE: DATE OF DISCHARGE:                              OPERATIVE REPORT   PREOPERATIVE DIAGNOSIS:  Comminuted complex greater than 5-part left distal radius fracture.  POSTOPERATIVE DIAGNOSIS:  Comminuted complex greater than 5-part left distal radius fracture.  PROCEDURES: 1. Open reduction and internal fixation of left distal radius     fracture. 2. Brachioradialis tenotomy. 3. Stress radiography.  SURGEON:  Dionne Ano. Amanda Pea, M.D.  ASSISTANT:  Karie Chimera, PA-C  COMPLICATIONS:  None.  ANESTHESIA:  General preoperative block.  TOURNIQUET TIME:  Less than an hour.  INDICATIONS FOR THE PROCEDURE:  This 76 year old female who is independent, active and lives alone.  She presents with the above- mentioned diagnosis and I have counseled her in regards to risks and benefits of surgery including risk of infection, bleeding, anesthesia, damage to normal structures, and failure of surgery to accomplish its intended goals of relieving symptoms and restoring function.  With this in mind, she desires to proceed.  All questions have been encouraged and answered preoperatively.  OPERATIVE PROCEDURE:  The patient was seen by myself, anesthesia, taken to the operative suite, underwent smooth induction of general anesthesia.  Time-out was called.  Body parts were well padded.  She was prepped and draped in usual sterile fashion.  Final time-out was called and the operation commenced with elevation of the tourniquet.  Following this, we performed dissection with sharp knife blade.  A 15-blade knife was placed over the FCR tendon sheath.  The skin was excised.  Following this, FCR tendon sheath was incised proximally and distally throughout the extend of the  incision and well into the forearm fascia to release the fascia in form of a fasciotomy with sliding blunt tip scissors. Once this done, I then incised the volar aspect of the FCR sheath and swept the carpal canal contents ulnarly followed by incising the pronator quadratus.  She had a very marked comminuted fracture and thus at this time, I performed a brachioradialis sliding tenotomy and very carefully lifted up the fragmented pieces and the pronator.  She tolerated this well.  At this time, I then reassembled the fracture. This was done with standard AO technique.  Irrigation was applied.  I was able to reassemble the fracture and achieved radial height inclination and volar tilt to my satisfaction.  All radiographic parameters looked well.  I then applied a DVR plate and screw construct in standard fashion.  The distal radioulnar joint was stable with stress testing and the stress radiograph showed excellent stability and no complicating features.  Thus, ORIF, sliding brachioradialis tenotomy and stress radiography were performed.  I closed the pronator with Vicryl, irrigated with greater than 1.5 liters of saline and closed the skin with Prolene after drain was placed deeply.  She had excellent refill, soft compartments, and no complications.  She will be admitted overnight for IV antibiotics, general postop observation, should any problems occur, will be on hand. I have  discussed her all do's and don'ts etc.  We will see her back in the office in 12 days.  Sutures to be removed.  Cast applied at 4-5 weeks splint to be made in therapy at 8 weeks strengthening and we will monitor her with serial radiographs to make sure that she has fracture healing to our satisfaction.  Do's and don'ts had been discussed.  All questions were encouraged and answered.     Dionne Ano. Amanda Pea, M.D.     [facility]  D:  03/15/2012  T:  03/16/2012  Job:  [id number]

## 2012-03-18 ENCOUNTER — Encounter (HOSPITAL_COMMUNITY): Payer: Self-pay | Admitting: Orthopedic Surgery

## 2012-03-25 ENCOUNTER — Ambulatory Visit: Payer: Medicare Other | Admitting: Radiation Oncology

## 2012-03-26 ENCOUNTER — Encounter (INDEPENDENT_AMBULATORY_CARE_PROVIDER_SITE_OTHER): Payer: Medicare Other | Admitting: Surgery

## 2012-04-22 ENCOUNTER — Ambulatory Visit: Admission: RE | Admit: 2012-04-22 | Payer: Medicare Other | Source: Ambulatory Visit | Admitting: Radiation Oncology

## 2012-04-24 ENCOUNTER — Encounter: Payer: Self-pay | Admitting: *Deleted

## 2012-04-26 ENCOUNTER — Encounter (INDEPENDENT_AMBULATORY_CARE_PROVIDER_SITE_OTHER): Payer: Self-pay | Admitting: Surgery

## 2012-05-13 ENCOUNTER — Telehealth: Payer: Self-pay | Admitting: *Deleted

## 2012-05-13 NOTE — Telephone Encounter (Signed)
Per patient request I have moved her lab appt from tomorrow to next week.  JMW

## 2012-05-14 ENCOUNTER — Other Ambulatory Visit: Payer: Medicare Other | Admitting: Lab

## 2012-05-16 ENCOUNTER — Encounter (INDEPENDENT_AMBULATORY_CARE_PROVIDER_SITE_OTHER): Payer: Medicare Other | Admitting: Surgery

## 2012-05-16 ENCOUNTER — Encounter (INDEPENDENT_AMBULATORY_CARE_PROVIDER_SITE_OTHER): Payer: Self-pay | Admitting: Surgery

## 2012-05-21 ENCOUNTER — Ambulatory Visit (HOSPITAL_BASED_OUTPATIENT_CLINIC_OR_DEPARTMENT_OTHER): Payer: Medicare Other | Admitting: Physician Assistant

## 2012-05-21 ENCOUNTER — Other Ambulatory Visit (HOSPITAL_BASED_OUTPATIENT_CLINIC_OR_DEPARTMENT_OTHER): Payer: Medicare Other | Admitting: Lab

## 2012-05-21 ENCOUNTER — Encounter: Payer: Self-pay | Admitting: Physician Assistant

## 2012-05-21 VITALS — BP 147/71 | HR 69 | Temp 97.9°F | Resp 20 | Ht 65.0 in | Wt 165.8 lb

## 2012-05-21 DIAGNOSIS — M810 Age-related osteoporosis without current pathological fracture: Secondary | ICD-10-CM

## 2012-05-21 DIAGNOSIS — F419 Anxiety disorder, unspecified: Secondary | ICD-10-CM | POA: Insufficient documentation

## 2012-05-21 DIAGNOSIS — F411 Generalized anxiety disorder: Secondary | ICD-10-CM

## 2012-05-21 DIAGNOSIS — C50419 Malignant neoplasm of upper-outer quadrant of unspecified female breast: Secondary | ICD-10-CM

## 2012-05-21 DIAGNOSIS — Z170 Estrogen receptor positive status [ER+]: Secondary | ICD-10-CM

## 2012-05-21 LAB — COMPREHENSIVE METABOLIC PANEL (CC13)
AST: 20 U/L (ref 5–34)
Albumin: 3.6 g/dL (ref 3.5–5.0)
Alkaline Phosphatase: 90 U/L (ref 40–150)
BUN: 13 mg/dL (ref 7.0–26.0)
Creatinine: 0.9 mg/dL (ref 0.6–1.1)
Potassium: 4.5 mEq/L (ref 3.5–5.1)

## 2012-05-21 LAB — CBC WITH DIFFERENTIAL/PLATELET
Basophils Absolute: 0 10*3/uL (ref 0.0–0.1)
EOS%: 3.6 % (ref 0.0–7.0)
HGB: 14.3 g/dL (ref 11.6–15.9)
MCH: 31.1 pg (ref 25.1–34.0)
MCV: 91.8 fL (ref 79.5–101.0)
MONO%: 11.9 % (ref 0.0–14.0)
RDW: 13.2 % (ref 11.2–14.5)

## 2012-05-21 MED ORDER — LORAZEPAM 0.5 MG PO TABS: 0.2500 mg | ORAL_TABLET | Freq: Every evening | ORAL | Status: DC | PRN

## 2012-05-21 NOTE — Progress Notes (Signed)
ID: Chloe Watkins   DOB: 1935/08/24  MR#: [id number]  WUJ#:[id number]  HISTORY OF PRESENT ILLNESS: The patient is a 77 year old [place] woman who on 07/28/2010 had [facility] In November 2012 however, when she saw her gynecologist, Chloe Watkins, he noted a mass in her left breast and set her up for left mammography and ultrasonography, performed 04/12/2011. This showed a high density lesion with irregular margins in the upper outer quadrant of the left breast which was palpable and mobile. The axilla was unremarkable by palpation and ultrasonography. Biopsy of this mass was performed 04/21/2011, and showed [phone]) an invasive ductal carcinoma, high-grade, estrogen receptor 100% positive, progesterone receptor 97% positive, with an MIB-1 of 27%, and no HER-2 amplification.  The patient then had a right diagnostic mammogram and 05/05/2011, which was unremarkable. Bilateral breast MRIs in 05/15/2011 showed a 2.1 cm enhancing mass in the upper outer left breast, with no other abnormal areas in either breast and no abnormal lymph nodes, bony or upper hepatic abnormalities noted.  On 05/24/2010, the patient underwent left lumpectomy and sentinel lymph node sampling under Chloe Watkins. This showed ([id number]) an invasive ductal carcinoma, grade 3, measuring 2.2 cm, with one of 2 sentinel lymph nodes involved by micrometastatic deposit. Margins were negative. Repeat HER-2 assessment was again negative. The patient's subsequent treatment is as detailed below.  INTERVAL HISTORY: Chloe Watkins returns today for followup of her left breast cancer. She continues on letrozole 2.5 mg daily which she tolerates well. She has only occasional hot flashes, nothing particularly problematic. She was previously having some bony aches and pains she attributed to letrozole but these have decreased significantly. She continues to have some postsurgical pain in the left breast and axilla.  She tried taking gabapentin at night but it made her to "woozy" the next day. She has some mild anxiety, but this is especially a problem at night and causes insomnia.   Otherwise, interval history is remarkable for Chloe Watkins having [place] the mall in November, fracturing her left wrist. She recently had the split removed, and is going through physical therapy now to increase range of motion.   REVIEW OF SYSTEMS: Chloe Watkins has had no recent illnesses and denies fevers or chills. She's had no skin changes. She denies abnormal bleeding. She's eating and drinking well with no nausea and no change in bowel or urinary habits. No cough, phlegm production, or increased shortness of breath. No chest pain or palpitations. No abnormal headaches or dizziness. No unusual myalgias or arthralgias. She has some occasional swelling in her feet and ankles which resolves with elevation.  A detailed review of systems is otherwise stable and noncontributory.   PAST MEDICAL HISTORY: Past Medical History  Diagnosis Date  . Anemia   . Clotting disorder     hx low platelets  . Hypertension   . Osteoporosis   . Thyroid disease   . Chills   . Hearing loss   . Leg swelling   . Diarrhea   . Arthritis pain   . Headache   . Bruises easily   . Breast cancer 04/21/2011    INVASIVE DUCTAL CA  . Arthritis   . PONV (postoperative nausea and vomiting)     did ok with last surgery 05/2011  . Shortness of breath     with exertion    PAST SURGICAL HISTORY: Past Surgical History  Procedure Date  . Vein ligation and stripping 1960  . Uterine fibroid surgery 1975  .  Ankle surgery 2000  . Wrist surgery 2000    right  . Eye surgery     lt cataract  . Breast lumpectomy 05/25/11    L breast, node bx: inv ductal, dcis, 1/2 nodes pos, ER/PR +, HER 2 -  . Cholecystectomy 1970  . Orif wrist fracture 03/15/2012    Procedure: OPEN REDUCTION INTERNAL FIXATION (ORIF) WRIST FRACTURE;  Surgeon: Chloe Severin, MD;  Location: [facility]  OR;  Service: Orthopedics;  Laterality: Left;    FAMILY HISTORY The patient's father died in his 89s from heart disease. The patient's mother died following a stroke at the age of 30. The patient had 4 sisters, one brother. The brother died from kidney cancer. One sister died 2 weeks ago from complications of diabetes. One sister died from melanoma at the age of 57. The 2 other sisters died secondary to strokes. There is no history of breast or ovarian cancer in the family.   GYNECOLOGIC HISTORY: GX P2, first pregnancy to term age 67. She had menopause age 20. She took hormones very briefly (discontinued because of HTN)  SOCIAL HISTORY: The patient used to work for [employer], but became disabled secondary to her multiple back injuries. She is divorced and lives by herself. Her son Chloe Watkins lives in [place] where he works at a Manufacturing systems engineer. Her daughter Chloe Watkins lives in [place] a few miles from the patient. She is a Futures trader. The patient has 1 grandchild and 2 great-grandchildren. She is not a Advice worker.    ADVANCED DIRECTIVES: not in place  HEALTH MAINTENANCE: History  Substance Use Topics  . Smoking status: Former Smoker    Quit date: 05/07/1981  . Smokeless tobacco: Never Used  . Alcohol Use: No     Colonoscopy: never  PAP: UTD ([place])  Bone density: 2012/ osteoporosis  Lipid panel:  Allergies  Allergen Reactions  . Hydrocodone Nausea And Vomiting  . Penicillins Itching and Swelling    Current Outpatient Prescriptions  Medication Sig Dispense Refill  . Ascorbic Acid (VITAMIN C) 1000 MG tablet Take 1,000 mg by mouth 2 (two) times daily.      Marland Kitchen atenolol (TENORMIN) 50 MG tablet Take 50 mg by mouth daily.       . Calcium 500 MG CHEW Chew 3 tablets by mouth daily.      . Cholecalciferol (VITAMIN D) 2000 UNITS CAPS Take 3 capsules by mouth daily.      Marland Kitchen ibuprofen (ADVIL,MOTRIN) 200 MG tablet Take 200 mg by mouth every 6 (six) hours as needed.      Marland Kitchen  letrozole (FEMARA) 2.5 MG tablet Take 2.5 mg by mouth daily.      . MULTIPLE VITAMINS PO Take 1 tablet by mouth daily.       Marland Kitchen acetaminophen (TYLENOL) 500 MG tablet Take 500 mg by mouth every 6 (six) hours as needed. Pain      . LORazepam (ATIVAN) 0.5 MG tablet Take 0.5 tablets (0.25 mg total) by mouth at bedtime as needed for anxiety (sleeplessness).  15 tablet  0  . methocarbamol (ROBAXIN) 500 MG tablet Take 1 tablet (500 mg total) by mouth 4 (four) times daily.  45 tablet  0    OBJECTIVE: Middle-aged white woman in no acute distress Filed Vitals:   05/21/12 1039  BP: 147/71  Pulse: 69  Temp: 97.9 F (36.6 C)  Resp: 20     Body mass index is 27.59 kg/(m^2).    ECOG FS: 1 Filed  Weights   05/21/12 1039  Weight: 165 lb 12.8 oz (75.206 kg)   Sclerae unicteric Oropharynx clear No peripheral adenopathy Lungs no rales or rhonchi Heart regular rate and rhythm Abdomen soft, nontender; positive bowel sounds MSK no focal spinal tenderness,  kyphosis on exam;  no peripheral edema Neuro: nonfocal, alert and oriented x3 Breasts: The right breast is unremarkable; the left breast is status post lumpectomy and radiation. There is no evidence of local recurrence. Axillae are benign bilaterally with no adenopathy.   LAB RESULTS: Lab Results  Component Value Date   WBC 5.6 05/21/2012   NEUTROABS 3.4 05/21/2012   HGB 14.3 05/21/2012   HCT 42.1 05/21/2012   MCV 91.8 05/21/2012   PLT 168 05/21/2012      Chemistry      Component Value Date/Time   NA 138 05/21/2012 1025   NA 134* 03/15/2012 0635   K 4.5 05/21/2012 1025   K 3.7 03/15/2012 0635   CL 102 05/21/2012 1025   CL 98 03/15/2012 0635   CO2 28 05/21/2012 1025   CO2 24 03/15/2012 0635   BUN 13.0 05/21/2012 1025   BUN 11 03/15/2012 0635   CREATININE 0.9 05/21/2012 1025   CREATININE 0.77 03/15/2012 0635      Component Value Date/Time   CALCIUM 9.6 05/21/2012 1025   CALCIUM 9.2 03/15/2012 0635   ALKPHOS 90 05/21/2012 1025   ALKPHOS 87 10/25/2011  0929   AST 20 05/21/2012 1025   AST 22 10/25/2011 0929   ALT 15 05/21/2012 1025   ALT 16 10/25/2011 0929   BILITOT 0.76 05/21/2012 1025   BILITOT 0.7 10/25/2011 0929       Lab Results  Component Value Date   LABCA2 25 06/05/2011    STUDIES:  Most recent bone density on 02/06/2012 showed osteoporosis.  Patient had a mammogram at [facility] in October 2013 which she tells me was "normal". I have requested a copy of that scan.   ASSESSMENT: 77 y.o.  [place] woman status post left lumpectomy and sentinel lymph node sampling 05/25/2011 for a T2 N1 (mic), Stage 2A invasive ductal carcinoma, grade 3, strongly estrogen and progesterone receptor positive, HER-2 negative, with an MIB-1 of 27%.  (1) completed radiation therapy April 2013  (2) started letrozole in June 2013  (3) History of osteoporosis   PLAN:  Chloe Watkins appears to be doing well with regards to her breast cancer and will continue on letrozole as before, 2.5 mg daily. We again discussed the possibility of starting Chloe Watkins on zoledronic acid. She has a molar that she thinks need extracting, so I encouraged her to have that taken care of as soon as possible. She will see Dr. Darnelle Catalan in September, and I will again revisit the possibility of initiating zoledronic acid at that time. In the meanwhile she will continue walking, and will continue on calcium and vitamin D.  We are going to try a low-dose of lorazepam, 0.25 mg at night as needed for anxiety and sleeplessness. She was given a prescription for 0.5 mg tablets, #15, with no refills.  She knows to call for any problems that may develop before her next scheduled appointment.  Chloe Watkins    05/21/2012

## 2012-06-05 ENCOUNTER — Encounter (INDEPENDENT_AMBULATORY_CARE_PROVIDER_SITE_OTHER): Payer: Medicare Other | Admitting: Surgery

## 2012-06-28 ENCOUNTER — Encounter (INDEPENDENT_AMBULATORY_CARE_PROVIDER_SITE_OTHER): Payer: Self-pay | Admitting: Surgery

## 2012-06-28 ENCOUNTER — Ambulatory Visit (INDEPENDENT_AMBULATORY_CARE_PROVIDER_SITE_OTHER): Payer: Medicare Other | Admitting: Surgery

## 2012-06-28 VITALS — BP 138/72 | HR 74 | Temp 98.3°F | Resp 16 | Ht 65.0 in | Wt 169.1 lb

## 2012-06-28 DIAGNOSIS — Z853 Personal history of malignant neoplasm of breast: Secondary | ICD-10-CM

## 2012-06-28 NOTE — Patient Instructions (Signed)
See [place] again in one year

## 2012-06-28 NOTE — Progress Notes (Signed)
NAME: Chloe Watkins       DOB: [birth year]           DATE: 06/28/2012       MRN: [id number]  CC:  Chief Complaint  Patient presents with  . Breast Cancer Long Term Follow Up    Breast lumpectomy 05/25/11    Chloe Watkins is a 77 y.o.Marland Kitchenfemale who presents for routine followup of her sTAGE iiB idc LEFT BREAST UPPER OUOTER QUADRANT diagnosed in 2012 and treated with Lumpectomy and SLN, . She has no problems or concerns on either side, except ongoing tenderness ath the lumpectomy site PFSH: She has had no significant changes since the last visit here.  ROS: There have been no significant changes since the last visit here  EXAM:  VS: BP 138/72  Pulse 74  Temp(Src) 98.3 F (36.8 C) (Temporal)  Resp 16  Ht 5\' 5"  (1.651 m)  Wt 169 lb 2 oz (76.715 kg)  BMI 28.14 kg/m2  General: The patient is alert, oriented, generally healthy appearing, NAD. Mood and affect are normal.  Breasts:  Right is normal. Left is smaller, loss of tissue secondary to the lumpectomy, no evidence for recurrence  Lymphatics: She has no axillary or supraclavicular adenopathy on either side.  Extremities: Full ROM of the surgical side with no lymphedema noted.  Data Reviewed: No new data available. She reports a mammogram done at [facility] in Oct was negative. We have requested the report  Impression: Doing well, with no evidence of recurrent cancer or new cancer  Plan: Will continue to follow up on an annual basis here.

## 2012-08-07 ENCOUNTER — Encounter (HOSPITAL_COMMUNITY): Payer: Self-pay | Admitting: Pharmacy Technician

## 2012-08-08 ENCOUNTER — Encounter (HOSPITAL_COMMUNITY): Payer: Self-pay | Admitting: Pharmacy Technician

## 2012-08-08 ENCOUNTER — Encounter (HOSPITAL_COMMUNITY)
Admission: RE | Admit: 2012-08-08 | Discharge: 2012-08-08 | Disposition: A | Discharge status: Home / Self Care | Payer: Medicare Other | Source: Ambulatory Visit | Attending: Ophthalmology | Admitting: Ophthalmology

## 2012-08-08 ENCOUNTER — Encounter (HOSPITAL_COMMUNITY): Payer: Self-pay

## 2012-08-08 NOTE — Patient Instructions (Addendum)
Chloe Watkins  08/08/2012   Your procedure is scheduled on:  Tuesday,April 8  Report to [facility] at 0730 AM.  Call this number if you have problems the morning of surgery: [phone]   Remember:   Do not eat food or drink liquids after midnight.   Take these medicines the morning of surgery with A SIP OF WATER: atenolol   Do not wear jewelry, make-up or nail polish.  Do not wear lotions, powders, or perfumes. You may wear deodorant.  Do not shave 48 hours prior to surgery. Men may shave face and neck.  Do not bring valuables to the hospital.  Contacts, dentures or bridgework may not be worn into surgery.  Leave suitcase in the car. After surgery it may be brought to your room.  For patients admitted to the hospital, checkout time is 11:00 AM the day of  discharge.   Patients discharged the day of surgery will not be allowed to drive  home.  Name and phone number of your driver: Grandson  Special Instructions: N/A   Please read over the following fact sheets that you were given: Pain Booklet, Coughing and Deep Breathing, MRSA Information, Surgical Site Infection Prevention, Anesthesia Post-op Instructions and Care and Recovery After Surgery  Cataract Surgery  A cataract is a clouding of the lens of the eye. When a lens becomes cloudy, vision is reduced based on the degree and nature of the clouding. Surgery may be needed to improve vision. Surgery removes the cloudy lens and usually replaces it with a substitute lens (intraocular lens, IOL). LET YOUR EYE DOCTOR KNOW ABOUT:  Allergies to food or medicine.  Medicines taken including herbs, eyedrops, over-the-counter medicines, and creams.  Use of steroids (by mouth or creams).  Previous problems with anesthetics or numbing medicine.  History of bleeding problems or blood clots.  Previous surgery.  Other health problems, including diabetes and kidney problems.  Possibility of pregnancy, if this applies. RISKS AND  COMPLICATIONS  Infection.  Inflammation of the eyeball (endophthalmitis) that can spread to both eyes (sympathetic ophthalmia).  Poor wound healing.  If an IOL is inserted, it can later fall out of proper position. This is very uncommon.  Clouding of the part of your eye that holds an IOL in place. This is called an "after-cataract." These are uncommon, but easily treated. BEFORE THE PROCEDURE  Do not eat or drink anything except small amounts of water for 8 to 12 before your surgery, or as directed by your caregiver.  Unless you are told otherwise, continue any eyedrops you have been prescribed.  Talk to your primary caregiver about all other medicines that you take (both prescription and non-prescription). In some cases, you may need to stop or change medicines near the time of your surgery. This is most important if you are taking blood-thinning medicine.Do not stop medicines unless you are told to do so.  Arrange for someone to drive you to and from the procedure.  Do not put contact lenses in either eye on the day of your surgery. PROCEDURE There is more than one method for safely removing a cataract. Your doctor can explain the differences and help determine which is best for you. Phacoemulsification surgery is the most common form of cataract surgery.  An injection is given behind the eye or eyedrops are given to make this a painless procedure.  A small cut (incision) is made on the edge of the clear, dome-shaped surface that covers the front of the  eye (cornea).  A tiny probe is painlessly inserted into the eye. This device gives off ultrasound waves that soften and break up the cloudy center of the lens. This makes it easier for the cloudy lens to be removed by suction.  An IOL may be implanted.  The normal lens of the eye is covered by a clear capsule. Part of that capsule is intentionally left in the eye to support the IOL.  Your surgeon may or may not use stitches to  close the incision. There are other forms of cataract surgery that require a larger incision and stiches to close the eye. This approach is taken in cases where the doctor feels that the cataract cannot be easily removed using phacoemulsification. AFTER THE PROCEDURE  When an IOL is implanted, it does not need care. It becomes a permanent part of your eye and cannot be seen or felt.  Your doctor will schedule follow-up exams to check on your progress.  Review your other medicines with your doctor to see which can be resumed after surgery.  Use eyedrops or take medicine as prescribed by your doctor.  PATIENT INSTRUCTIONS POST-ANESTHESIA  IMMEDIATELY FOLLOWING SURGERY:  Do not drive or operate machinery for the first twenty four hours after surgery.  Do not make any important decisions for twenty four hours after surgery or while taking narcotic pain medications or sedatives.  If you develop intractable nausea and vomiting or a severe headache please notify your doctor immediately.  FOLLOW-UP:  Please make an appointment with your surgeon as instructed. You do not need to follow up with anesthesia unless specifically instructed to do so.  WOUND CARE INSTRUCTIONS (if applicable):  Keep a dry clean dressing on the anesthesia/puncture wound site if there is drainage.  Once the wound has quit draining you may leave it open to air.  Generally you should leave the bandage intact for twenty four hours unless there is drainage.  If the epidural site drains for more than 36-48 hours please call the anesthesia department.  QUESTIONS?:  Please feel free to call your physician or the hospital operator if you have any questions, and they will be happy to assist you.

## 2012-08-27 ENCOUNTER — Ambulatory Visit (HOSPITAL_COMMUNITY): Admission: RE | Admit: 2012-08-27 | Payer: Medicare Other | Source: Ambulatory Visit | Admitting: Ophthalmology

## 2012-08-27 ENCOUNTER — Encounter (HOSPITAL_COMMUNITY): Admission: RE | Payer: Self-pay | Source: Ambulatory Visit

## 2012-08-27 SURGERY — PHACOEMULSIFICATION, CATARACT, WITH IOL INSERTION
Anesthesia: Monitor Anesthesia Care | Laterality: Right

## 2012-10-23 NOTE — OR Nursing (Signed)
Surgery cancelled  by patient, she stated she didn't want to have surgery at this time.

## 2012-11-16 ENCOUNTER — Other Ambulatory Visit: Payer: Self-pay | Admitting: Oncology

## 2012-11-16 DIAGNOSIS — C50419 Malignant neoplasm of upper-outer quadrant of unspecified female breast: Secondary | ICD-10-CM

## 2013-01-20 ENCOUNTER — Other Ambulatory Visit (HOSPITAL_BASED_OUTPATIENT_CLINIC_OR_DEPARTMENT_OTHER): Payer: Medicare Other | Admitting: Lab

## 2013-01-20 DIAGNOSIS — C50912 Malignant neoplasm of unspecified site of left female breast: Secondary | ICD-10-CM

## 2013-01-20 DIAGNOSIS — C50419 Malignant neoplasm of upper-outer quadrant of unspecified female breast: Secondary | ICD-10-CM

## 2013-01-20 DIAGNOSIS — M810 Age-related osteoporosis without current pathological fracture: Secondary | ICD-10-CM

## 2013-01-20 LAB — CBC WITH DIFFERENTIAL/PLATELET
Basophils Absolute: 0 10*3/uL (ref 0.0–0.1)
EOS%: 4.6 % (ref 0.0–7.0)
HCT: 45 % (ref 34.8–46.6)
HGB: 14.8 g/dL (ref 11.6–15.9)
LYMPH%: 20 % (ref 14.0–49.7)
MCH: 30 pg (ref 25.1–34.0)
MCV: 91.3 fL (ref 79.5–101.0)
MONO%: 9.8 % (ref 0.0–14.0)
NEUT%: 65 % (ref 38.4–76.8)
Platelets: 173 10*3/uL (ref 145–400)

## 2013-01-20 LAB — COMPREHENSIVE METABOLIC PANEL (CC13)
AST: 22 U/L (ref 5–34)
Alkaline Phosphatase: 87 U/L (ref 40–150)
BUN: 9 mg/dL (ref 7.0–26.0)
Creatinine: 0.9 mg/dL (ref 0.6–1.1)

## 2013-01-27 ENCOUNTER — Ambulatory Visit (HOSPITAL_BASED_OUTPATIENT_CLINIC_OR_DEPARTMENT_OTHER): Payer: Medicare Other | Admitting: Oncology

## 2013-01-27 ENCOUNTER — Telehealth: Payer: Self-pay | Admitting: *Deleted

## 2013-01-27 VITALS — BP 145/83 | HR 71 | Temp 98.7°F | Resp 18 | Ht 66.0 in | Wt 167.4 lb

## 2013-01-27 DIAGNOSIS — C50419 Malignant neoplasm of upper-outer quadrant of unspecified female breast: Secondary | ICD-10-CM

## 2013-01-27 DIAGNOSIS — C50412 Malignant neoplasm of upper-outer quadrant of left female breast: Secondary | ICD-10-CM

## 2013-01-27 MED ORDER — LETROZOLE 2.5 MG PO TABS: 2.5000 mg | ORAL_TABLET | Freq: Every day | ORAL | Status: DC

## 2013-01-27 NOTE — Progress Notes (Signed)
 ID: Chloe Watkins   DOB: 1935/05/10  MR#: [id number]  JXB#:[id number]  HISTORY OF PRESENT ILLNESS: The patient is a 77 year old [place] woman who on 07/28/2010 [facility] In November 2012 however, when she saw her gynecologist, Chloe Watkins, he noted a mass in her left breast and set her up for left mammography and ultrasonography, performed 04/12/2011. This showed a high density lesion with irregular margins in the upper outer quadrant of the left breast which was palpable and mobile. The axilla was unremarkable by palpation and ultrasonography. Biopsy of this mass was performed 04/21/2011, and showed [phone]) an invasive ductal carcinoma, high-grade, estrogen receptor 100% positive, progesterone receptor 97% positive, with an MIB-1 of 27%, and no HER-2 amplification.  The patient then had a right diagnostic mammogram and 05/05/2011, which was unremarkable. Bilateral breast MRIs in 05/15/2011 showed a 2.1 cm enhancing mass in the upper outer left breast, with no other abnormal areas in either breast and no abnormal lymph nodes, bony or upper hepatic abnormalities noted.  On 05/24/2010, the patient underwent left lumpectomy and sentinel lymph node sampling under Chloe Watkins. This showed ([id number]) an invasive ductal carcinoma, grade 3, measuring 2.2 cm, with one of 2 sentinel lymph nodes involved by micrometastatic deposit. Margins were negative. Repeat HER-2 assessment was again negative. The patient's subsequent treatment is as detailed below.  INTERVAL HISTORY: Chloe Watkins returns today for followup of her left breast cancer. The interval history is significant for her having fallen and fractured her left wrist. She does understand her bones are "weak". She healed just fine though and has had no residual dysfunction --.  REVIEW OF SYSTEMS: Chloe Watkins has mild sleep difficulties, mostly falling asleep. She gets cramps in her lower legs sometimes. These are  intermittent. She just had LASIK surgery and likes the results. Rarely her ankles swell. She bruises easily. She has arthritis pains here and there but nothing more persistent or intense than prior. Her hot flashes are mild. A detailed review of systems today was otherwise noncontributory   PAST MEDICAL HISTORY: Past Medical History  Diagnosis Date  . Anemia   . Clotting disorder     hx low platelets  . Hypertension   . Osteoporosis   . Thyroid disease   . Chills   . Hearing loss   . Leg swelling   . Diarrhea   . Arthritis pain   . Headache   . Bruises easily   . Breast cancer 04/21/2011    INVASIVE DUCTAL CA  . Arthritis   . PONV (postoperative nausea and vomiting)     did ok with last surgery 05/2011  . Shortness of breath     with exertion    PAST SURGICAL HISTORY: Past Surgical History  Procedure Laterality Date  . Vein ligation and stripping  1960  . Uterine fibroid surgery  1975  . Ankle surgery  2000  . Wrist surgery  2000    right  . Eye surgery      lt cataract  . Breast lumpectomy  05/25/11    L breast, node bx: inv ductal, dcis, 1/2 nodes pos, ER/PR +, HER 2 -  . Cholecystectomy  1970  . Orif wrist fracture  03/15/2012    Procedure: OPEN REDUCTION INTERNAL FIXATION (ORIF) WRIST FRACTURE;  Surgeon: Dominica Severin, MD;  Location: [facility];  Service: Orthopedics;  Laterality: Left;    FAMILY HISTORY The patient's father died in his 99s from heart disease. The patient's mother  died following a stroke at the age of 63. The patient had 4 sisters, one brother. The brother died from kidney cancer. One sister died 2 weeks ago from complications of diabetes. One sister died from melanoma at the age of 72. The 2 other sisters died secondary to strokes. There is no history of breast or ovarian cancer in the family.   GYNECOLOGIC HISTORY: [place] P2, first pregnancy to term age 4. She had menopause age 35. She took hormones very briefly (discontinued because of HTN)  SOCIAL  HISTORY: The patient used to work for [employer], but became disabled secondary to her multiple back injuries. She is divorced and lives by herself. Her son Chloe Watkins lives in [place] where he works at a Manufacturing systems engineer. Her daughter Chloe Watkins lives in [place] a few miles from the patient. She is a Futures trader. The patient has 1 grandchild and 2 great-grandchildren. She is not a Advice worker.    ADVANCED DIRECTIVES: not in place  HEALTH MAINTENANCE: History  Substance Use Topics  . Smoking status: Former Smoker -- 1.00 packs/day for 15 years    Types: Cigarettes    Quit date: 05/07/1981  . Smokeless tobacco: Never Used  . Alcohol Use: No     Colonoscopy: never  PAP: UTD ([place])  Bone density: October of 2013, shows osteopenia, with a lowest T score at -1.8  Lipid panel:  Allergies  Allergen Reactions  . Penicillins Itching and Swelling    Current Outpatient Prescriptions  Medication Sig Dispense Refill  . Ascorbic Acid (VITAMIN C) 1000 MG tablet Take 1,000 mg by mouth 2 (two) times daily.      Marland Kitchen atenolol (TENORMIN) 50 MG tablet Take 50 mg by mouth daily.       . Calcium 500 MG CHEW Chew 3 tablets by mouth daily.      . cholecalciferol (VITAMIN D) 1000 UNITS tablet Take 1,000 Units by mouth 2 (two) times daily.      Marland Kitchen letrozole (FEMARA) 2.5 MG tablet TAKE 1 TABLET EVERY DAY  90 tablet  1  . MULTIPLE VITAMINS PO Take 1 tablet by mouth daily.       . Naproxen Sodium (ALEVE) 220 MG CAPS Take 1 capsule by mouth as needed. pain       No current facility-administered medications for this visit.    OBJECTIVE: Middle-aged white woman who appears his stated age 41 Vitals:   01/27/13 1009  BP: 145/83  Pulse: 71  Temp: 98.7 F (37.1 C)  Resp: 18     Body mass index is 27.03 kg/(m^2).    ECOG FS: 1 Filed Weights   01/27/13 1009  Weight: 167 lb 6.4 oz (75.932 kg)   Sclerae unicteric, pupils equal round and reactive to light Oropharynx clear, dentition is  fair No cervical or supraclavicular adenopathy Lungs no rales or rhonchi, good excursion bilaterally Heart regular rate and rhythm, no murmur appreciated Abdomen soft, nontender; positive bowel sounds MSK no focal spinal tenderness,  kyphosis on exam;  no peripheral edema Neuro: nonfocal, well oriented, appropriate affect Breasts: The right breast is unremarkable; the left breast is status post lumpectomy and radiation. There is no evidence of local recurrence. The left axilla is benign  LAB RESULTS: Thank youResults for Chloe Watkins, Chloe Watkins (MRN [id number]) as of 01/27/2013 10:30  Ref. Range 10/25/2011 09:29 01/25/2012 09:16 01/20/2013 09:36  Vit D, 25-Hydroxy Latest Range: 30-89 ng/mL 35 51 56    Lab Results  Component Value Date  WBC 5.8 01/20/2013   NEUTROABS 3.8 01/20/2013   HGB 14.8 01/20/2013   HCT 45.0 01/20/2013   MCV 91.3 01/20/2013   PLT 173 01/20/2013      Chemistry      Component Value Date/Time   NA 138 01/20/2013 0936   NA 134* 03/15/2012 0635   K 4.2 01/20/2013 0936   K 3.7 03/15/2012 0635   CL 102 05/21/2012 1025   CL 98 03/15/2012 0635   CO2 26 01/20/2013 0936   CO2 24 03/15/2012 0635   BUN 9.0 01/20/2013 0936   BUN 11 03/15/2012 0635   CREATININE 0.9 01/20/2013 0936   CREATININE 0.77 03/15/2012 0635      Component Value Date/Time   CALCIUM 9.3 01/20/2013 0936   CALCIUM 9.2 03/15/2012 0635   ALKPHOS 87 01/20/2013 0936   ALKPHOS 87 10/25/2011 0929   AST 22 01/20/2013 0936   AST 22 10/25/2011 0929   ALT 19 01/20/2013 0936   ALT 16 10/25/2011 0929   BILITOT 0.69 01/20/2013 0936   BILITOT 0.7 10/25/2011 0929       Lab Results  Component Value Date   LABCA2 25 06/05/2011    STUDIES:  Most recent bone density on 02/06/2012 showed osteopenia  Repeat mammography due October of this year.   ASSESSMENT: 77 y.o.  [place] woman status post left lumpectomy and sentinel lymph node sampling 05/25/2011 for a T2 N1 (mic), Stage 2A invasive ductal carcinoma, grade 3, strongly estrogen  and progesterone receptor positive, HER-2 negative, with an MIB-1 of 27%.  (1) completed radiation therapy April 2013  (2) started letrozole in June 2013  (3) History of osteopenia   PLAN:  [place] is doing fine from a breast cancer point of view, and the plan is to continue the letrozole to June of 2018. She is a good vitamin D level and is doing some walking and gardening. I am reluctant to start bisphosphonates because she has a mole her that she tells me needs to be removed. I urged her to go ahead and get that done. If she does we can consider zolendronate at her next visit which will be 8 months from now.   She will be scheduled for repeat mammography October of this year and I have encouraged her to have a "3-D" tomography since she has dense breasts.  Ethyle knows to call for any problems that may develop before her next visit here.  , C    01/27/2013

## 2013-01-27 NOTE — Telephone Encounter (Signed)
Pt called inquiring about when prescription would be called in to her pharmacy ([facility] in [place] ) for pick up.  Noted prescription escribed with confirmation as received by above pharmacy this am.  This RN informed pt of above.

## 2013-01-30 ENCOUNTER — Telehealth: Payer: Self-pay | Admitting: Oncology

## 2013-01-30 NOTE — Telephone Encounter (Signed)
lmonvm advising the pt of her may 2015 appts. °

## 2013-05-28 ENCOUNTER — Encounter (INDEPENDENT_AMBULATORY_CARE_PROVIDER_SITE_OTHER): Payer: Self-pay | Admitting: General Surgery

## 2013-06-26 ENCOUNTER — Ambulatory Visit (INDEPENDENT_AMBULATORY_CARE_PROVIDER_SITE_OTHER): Payer: Medicare Other | Admitting: General Surgery

## 2013-06-30 ENCOUNTER — Ambulatory Visit (INDEPENDENT_AMBULATORY_CARE_PROVIDER_SITE_OTHER): Payer: Medicare Other | Admitting: General Surgery

## 2013-07-11 ENCOUNTER — Ambulatory Visit (INDEPENDENT_AMBULATORY_CARE_PROVIDER_SITE_OTHER): Payer: Medicare Other | Admitting: General Surgery

## 2013-07-11 ENCOUNTER — Other Ambulatory Visit: Payer: Self-pay | Admitting: *Deleted

## 2013-07-11 ENCOUNTER — Encounter (INDEPENDENT_AMBULATORY_CARE_PROVIDER_SITE_OTHER): Payer: Self-pay | Admitting: General Surgery

## 2013-07-11 ENCOUNTER — Other Ambulatory Visit: Payer: Self-pay | Admitting: Oncology

## 2013-07-11 VITALS — BP 128/78 | HR 78 | Temp 96.2°F | Resp 14 | Ht 65.5 in | Wt 167.0 lb

## 2013-07-11 DIAGNOSIS — C50919 Malignant neoplasm of unspecified site of unspecified female breast: Secondary | ICD-10-CM

## 2013-07-11 MED ORDER — LETROZOLE 2.5 MG PO TABS: 2.5000 mg | ORAL_TABLET | Freq: Every day | ORAL | Status: DC

## 2013-07-11 NOTE — Patient Instructions (Signed)
Dry scar massage and exercise for the breast incision pain.

## 2013-07-11 NOTE — Progress Notes (Signed)
Procedure:  Left breast lumpectomy and left axillary sentinel lymph node biopsy for left breast cancer  Date: 05/25/2011  Hx:  This is a patient of doctors tracks who underwent the above procedure. She is on endocrine therapy. She does get some occasional sharp pains in the area of the lumpectomy. No new masses. No lymphadenopathy  PE:  Breasts- Right breast is without palpable masses or skin changes.  Left breast demonstrates a well healed left upper outer quadrant scar with some indentation and firmness. No palpable mass.  Lymph nodes-No palpable supraclavicular or axillary adenopathy.  Assessment:  Left breast cancer status post left breast lumpectomy, left x-ray sentinel lymph node biopsy, radiation therapy. Currently on endocrine therapy. No clinical evidence of recurrence.  Plan:  Return visit one year.

## 2013-07-14 ENCOUNTER — Other Ambulatory Visit: Payer: Self-pay | Admitting: *Deleted

## 2013-07-14 DIAGNOSIS — C50919 Malignant neoplasm of unspecified site of unspecified female breast: Secondary | ICD-10-CM

## 2013-07-14 MED ORDER — LETROZOLE 2.5 MG PO TABS: 2.5000 mg | ORAL_TABLET | Freq: Every day | ORAL | Status: DC

## 2013-08-29 ENCOUNTER — Telehealth: Payer: Self-pay | Admitting: Physician Assistant

## 2013-08-29 NOTE — Telephone Encounter (Signed)
, °

## 2013-09-24 ENCOUNTER — Telehealth: Payer: Self-pay | Admitting: Physician Assistant

## 2013-09-24 NOTE — Telephone Encounter (Signed)
pt cld to resch appt-rech and gave pt new time & date

## 2013-09-25 ENCOUNTER — Other Ambulatory Visit: Payer: Medicare Other

## 2013-09-25 ENCOUNTER — Ambulatory Visit: Payer: Medicare Other | Admitting: Physician Assistant

## 2013-09-29 ENCOUNTER — Ambulatory Visit: Payer: Medicare Other | Admitting: Physician Assistant

## 2013-10-06 ENCOUNTER — Other Ambulatory Visit: Payer: Self-pay

## 2013-10-06 ENCOUNTER — Ambulatory Visit: Payer: Self-pay | Admitting: Physician Assistant

## 2013-10-15 ENCOUNTER — Ambulatory Visit (HOSPITAL_BASED_OUTPATIENT_CLINIC_OR_DEPARTMENT_OTHER): Payer: Medicare Other | Admitting: Physician Assistant

## 2013-10-15 ENCOUNTER — Other Ambulatory Visit (HOSPITAL_BASED_OUTPATIENT_CLINIC_OR_DEPARTMENT_OTHER): Payer: Medicare Other

## 2013-10-15 ENCOUNTER — Ambulatory Visit: Payer: Self-pay | Admitting: Physician Assistant

## 2013-10-15 ENCOUNTER — Encounter: Payer: Self-pay | Admitting: Physician Assistant

## 2013-10-15 ENCOUNTER — Other Ambulatory Visit: Payer: Self-pay

## 2013-10-15 VITALS — BP 151/75 | HR 60 | Temp 98.8°F | Resp 18 | Ht 65.0 in | Wt 164.6 lb

## 2013-10-15 DIAGNOSIS — Z79811 Long term (current) use of aromatase inhibitors: Secondary | ICD-10-CM

## 2013-10-15 DIAGNOSIS — R21 Rash and other nonspecific skin eruption: Secondary | ICD-10-CM | POA: Insufficient documentation

## 2013-10-15 DIAGNOSIS — C50412 Malignant neoplasm of upper-outer quadrant of left female breast: Secondary | ICD-10-CM

## 2013-10-15 DIAGNOSIS — Z170 Estrogen receptor positive status [ER+]: Secondary | ICD-10-CM

## 2013-10-15 DIAGNOSIS — R609 Edema, unspecified: Secondary | ICD-10-CM

## 2013-10-15 DIAGNOSIS — R600 Localized edema: Secondary | ICD-10-CM

## 2013-10-15 DIAGNOSIS — M8580 Other specified disorders of bone density and structure, unspecified site: Secondary | ICD-10-CM

## 2013-10-15 DIAGNOSIS — C50419 Malignant neoplasm of upper-outer quadrant of unspecified female breast: Secondary | ICD-10-CM

## 2013-10-15 DIAGNOSIS — Z780 Asymptomatic menopausal state: Secondary | ICD-10-CM

## 2013-10-15 DIAGNOSIS — Z853 Personal history of malignant neoplasm of breast: Secondary | ICD-10-CM

## 2013-10-15 DIAGNOSIS — M949 Disorder of cartilage, unspecified: Secondary | ICD-10-CM

## 2013-10-15 DIAGNOSIS — M899 Disorder of bone, unspecified: Secondary | ICD-10-CM

## 2013-10-15 LAB — CBC WITH DIFFERENTIAL/PLATELET
BASO%: 0.6 % (ref 0.0–2.0)
Basophils Absolute: 0 10*3/uL (ref 0.0–0.1)
EOS%: 4.5 % (ref 0.0–7.0)
Eosinophils Absolute: 0.3 10*3/uL (ref 0.0–0.5)
HEMATOCRIT: 40.9 % (ref 34.8–46.6)
HGB: 13 g/dL (ref 11.6–15.9)
LYMPH#: 1.9 10*3/uL (ref 0.9–3.3)
LYMPH%: 23.8 % (ref 14.0–49.7)
MCH: 29.1 pg (ref 25.1–34.0)
MCHC: 31.8 g/dL (ref 31.5–36.0)
MCV: 91.5 fL (ref 79.5–101.0)
MONO#: 0.9 10*3/uL (ref 0.1–0.9)
MONO%: 11.1 % (ref 0.0–14.0)
NEUT#: 4.7 10*3/uL (ref 1.5–6.5)
NEUT%: 60 % (ref 38.4–76.8)
Platelets: 196 10*3/uL (ref 145–400)
RBC: 4.47 10*6/uL (ref 3.70–5.45)
RDW: 13.6 % (ref 11.2–14.5)
WBC: 7.8 10*3/uL (ref 3.9–10.3)

## 2013-10-15 NOTE — Progress Notes (Signed)
ID: Chloe Watkins   DOB: [birth year]  MR#: [id number]  OEV#:[id number]  PCP:  Haywood Pao, MD GYN:  Rexford Maus, MD SU:  Jackolyn Confer, MD OTHER:  Gery Pray, MD   CHIEF COMPLAINT:  Hx of Left Breast Cancer (letrozole)   HISTORY OF PRESENT ILLNESS: The patient is a [place] woman who on 07/28/2010 had an unremarkable screening mammogram at the [facility]. In November 2012 however, when she saw her gynecologist, Rexford Maus, he noted a mass in her left breast and set her up for left mammography and ultrasonography, performed 04/12/2011. This showed a high density lesion with irregular margins in the upper outer quadrant of the left breast which was palpable and mobile. The axilla was unremarkable by palpation and ultrasonography. Biopsy of this mass was performed 04/21/2011, and showed [phone]) an invasive ductal carcinoma, high-grade, estrogen receptor 100% positive, progesterone receptor 97% positive, with an MIB-1 of 27%, and no HER-2 amplification.  The patient then had a right diagnostic mammogram and 05/05/2011, which was unremarkable. Bilateral breast MRIs in 05/15/2011 showed a 2.1 cm enhancing mass in the upper outer left breast, with no other abnormal areas in either breast and no abnormal lymph nodes, bony or upper hepatic abnormalities noted.  On 05/24/2010, the patient underwent left lumpectomy and sentinel lymph node sampling under [facility]. This showed ([id number]) an invasive ductal carcinoma, grade 3, measuring 2.2 cm, with one of 2 sentinel lymph nodes involved by micrometastatic deposit. Margins were negative. Repeat HER-2 assessment was again negative. The patient's subsequent treatment is as detailed below.  INTERVAL HISTORY: Chloe Watkins returns alone today for routine followup of her left breast cancer. She continues on letrozole, which she has been tolerating well since 2013. She has no significant hot flashes, no increased joint pain, and no vaginal dryness.    Approximately one month ago, however, she refilled her prescription. She took the new pills for approximately one week, and very quickly noticed swelling bilaterally in her lower extremities from the knees down. She also had skin changes and a flat red rash along with increased itching. She then noticed that the manufacturer had changed on her letrozole, although she was buying it at the same pharmacy. (Previously they were from [place], and are now from [place].)  She had a few of the "old pills" left and started taking these again.  She feels like the swelling did in fact improve, and the redness decreased on the legs.  Otherwise, Chloe Watkins's only other complaint today is continued postsurgical pain in the left breast. She finds that if she massages the breast as recommended by her surgeon, there is less pain and fewer muscle spasms.   REVIEW OF SYSTEMS: Chloe Watkins denies any recent illnesses and has had no fevers, chills, or night sweats. Her energy level is fairly good. She admits that she does not walk or exercise on a regular basis [place]". She denies any abnormal bruising or bleeding. Her appetite is great, and she's had no nausea or emesis. She does have a molar that is extremely sensitive and needs dental care. She's had no change in bowel or bladder habits. She denies any increased cough, production, shortness of breath, chest pain, or palpitations. She's had no abnormal headaches or dizziness. She has mild arthritic pain when it is cold outside, but notices no increased myalgias, arthralgias, or bony pain overall.  A detailed review of systems is otherwise stable and noncontributory.  PAST MEDICAL HISTORY: Past Medical History  Diagnosis Date  . Anemia   . Clotting disorder     hx low platelets  . Hypertension   . Osteoporosis   . Thyroid disease   . Chills   . Hearing loss   . Leg swelling   . Diarrhea    . Arthritis pain   . Headache(784.0)   . Bruises easily   . Breast cancer 04/21/2011    INVASIVE DUCTAL CA  . Arthritis   . PONV (postoperative nausea and vomiting)     did ok with last surgery 05/2011  . Shortness of breath     with exertion    PAST SURGICAL HISTORY: Past Surgical History  Procedure Laterality Date  . Vein ligation and stripping  1960  . Uterine fibroid surgery  1975  . Ankle surgery  2000  . Wrist surgery  2000    right  . Eye surgery      lt cataract  . Breast lumpectomy  05/25/11    L breast, node bx: inv ductal, dcis, 1/2 nodes pos, ER/PR +, HER 2 -  . Cholecystectomy  1970  . Orif wrist fracture  03/15/2012    Procedure: OPEN REDUCTION INTERNAL FIXATION (ORIF) WRIST FRACTURE;  Surgeon: Roseanne Kaufman, MD;  Location: [facility];  Service: Orthopedics;  Laterality: Left;    FAMILY HISTORY The patient's father died in his 83s from heart disease. The patient's mother died following a stroke at the age of 54. The patient had 4 sisters, one brother. The brother died from kidney cancer. One sister died 2 weeks ago from complications of diabetes. One sister died from melanoma at the age of 44. The 2 other sisters died secondary to strokes. There is no history of breast or ovarian cancer in the family.   GYNECOLOGIC HISTORY:  (Reviewed 10/15/2013)  [place] P2, first pregnancy to term age 71. She had menopause age 78. She took hormones very briefly (discontinued because of HTN)  SOCIAL HISTORY: (REVIEWED 10/15/2013)  The patient used to work for [employer], but became disabled secondary to her multiple back injuries. She is divorced and lives by herself. Her son Chloe Watkins lives in [place] where he works at a [employer]. Her daughter Chloe Watkins lives in [place]., a few miles from the patient. She is a Agricultural engineer. The patient has 1 grandchild and 2 great-grandchildren. She is not a Ambulance person.    ADVANCED DIRECTIVES: not in place  HEALTH MAINTENANCE:   (updated 10/15/2013) History  Substance Use Topics  . Smoking status: Former Smoker -- 1.00 packs/day for 15 years    Types: Cigarettes    Quit date: 05/07/1981  . Smokeless tobacco: Never Used  . Alcohol Use: No     Colonoscopy: never  PAP: UTD/Dr. Ubaldo Glassing (Not on file)  Bone density: October of 2013, shows osteopenia, with a lowest T score at -1.8  Lipid panel:  Not on file/Dr. Tisovec   Allergies  Allergen Reactions  . Penicillins Itching and Swelling    Current Outpatient Prescriptions  Medication Sig Dispense Refill  . Ascorbic Acid (VITAMIN C) 1000 MG tablet Take 1,000 mg by mouth 2 (two) times daily.      Marland Kitchen atenolol (TENORMIN) 50 MG tablet Take 50 mg by mouth daily.       . Calcium 500 MG CHEW Chew 3 tablets by mouth daily.      . cholecalciferol (VITAMIN D) 1000 UNITS tablet Take 1,000 Units by mouth 2 (  two) times daily.      Marland Kitchen letrozole (FEMARA) 2.5 MG tablet Take 1 tablet (2.5 mg total) by mouth daily.  90 tablet  3  . MULTIPLE VITAMINS PO Take 1 tablet by mouth daily.       . Naproxen Sodium (ALEVE) 220 MG CAPS Take 1 capsule by mouth as needed. pain       No current facility-administered medications for this visit.    OBJECTIVE: Middle-aged white woman who appears comfortable and is in no acute distress  Filed Vitals:   10/15/13 1505  BP: 151/75  Pulse: 60  Temp: 98.8 F (37.1 C)  Resp: 18     Body mass index is 27.39 kg/(m^2).    ECOG FS: 0 Filed Weights   10/15/13 1505  Weight: 164 lb 9.6 oz (74.662 kg)   Physical Exam: HEENT:  Sclerae anicteric.  Oropharynx clear with no ulcerations or lesions and no evidence of thrush. Buccal mucosa is pink and moist. Neck supple, trachea midline. No thyromegaly..  NODES:  No cervical or supraclavicular lymphadenopathy palpated.  BREAST EXAM:  Right breast is unremarkable. Left breast is status post lumpectomy. No suspicious nodularities or skin changes. There is some palpable scar tissue beneath the incision. No  evidence of local recurrence. Axillae are benign bilaterally, with no palpable lymphadenopathy. LUNGS:  Clear to auscultation bilaterally.  No wheezes or rhonchi HEART:  Regular rate and rhythm. No murmur appreciated ABDOMEN:  Soft, nontender. No organomegaly or masses palpated. Positive bowel sounds.  MSK:  No focal spinal tenderness to palpation. Good range of motion bilaterally in the upper extremities. EXTREMITIES:  Nonpitting pedal edema bilaterally, equal bilaterally and extending from the ankle to the knee. No lymphedema noted in the left upper extremity. SKIN:  The skin around the lower portion of the legs and ankles is slightly erythematous, but with no actual skin lesions, pustules, or vesicles. No other rashes are visualized today. No excessive ecchymoses. No petechiae. No pallor. NEURO:  Nonfocal. Well oriented.  Appropriate affect.    LAB RESULTS:  Lab Results  Component Value Date   WBC 7.8 10/15/2013   NEUTROABS 4.7 10/15/2013   HGB 13.0 10/15/2013   HCT 40.9 10/15/2013   MCV 91.5 10/15/2013   PLT 196 10/15/2013      Chemistry      Component Value Date/Time   NA 138 01/20/2013 0936   NA 134* 03/15/2012 0635   K 4.2 01/20/2013 0936   K 3.7 03/15/2012 0635   CL 102 05/21/2012 1025   CL 98 03/15/2012 0635   CO2 26 01/20/2013 0936   CO2 24 03/15/2012 0635   BUN 9.0 01/20/2013 0936   BUN 11 03/15/2012 0635   CREATININE 0.9 01/20/2013 0936   CREATININE 0.77 03/15/2012 0635      Component Value Date/Time   CALCIUM 9.3 01/20/2013 0936   CALCIUM 9.2 03/15/2012 0635   ALKPHOS 87 01/20/2013 0936   ALKPHOS 87 10/25/2011 0929   AST 22 01/20/2013 0936   AST 22 10/25/2011 0929   ALT 19 01/20/2013 0936   ALT 16 10/25/2011 0929   BILITOT 0.69 01/20/2013 0936   BILITOT 0.7 10/25/2011 0929       STUDIES:  Most recent bone density at [facility] on 02/06/2012 showed osteopenia  Bilateral mammogram on 03/06/2013 at [facility] was unremarkable. .   ASSESSMENT: 78 y.o.  [place]  woman   (1)  status post left lumpectomy and sentinel lymph node sampling 05/25/2011 for a T2 N1 (mic),  Stage 2A invasive ductal carcinoma, grade 3, strongly estrogen and progesterone receptor positive, HER-2 negative, with an MIB-1 of 27%.  (2) completed radiation therapy April 2013  (3) started letrozole in June 2013, the plan being to continue for total of 5 years   (4) osteopenia      PLAN:  The majority of our 25 minute appointment today was spent in addressing Chloe Watkins's concerns, reviewing her bone density results, discussing treatment options, and coordinating care.  Chloe Watkins  appears to be doing well with regards to her breast cancer, and is due for her next annual mammogram as well as her next bone density in early November of this year.  With regards to her letrozole, I'm making no changes in her current treatment plan, but I have suggested that she hold the letrozole for the next 3 weeks and see if the swelling and rash completely resolves. If it does not, she understands that she needs to see her primary care physician for further evaluation, possibly for mild venous insufficiency. If it does resolve, however, perhaps it was in fact an intolerance/reaction due to the change in manufacturer of the letrozole. She can restart the letrozole on July 1, but plans to start back on the "old pills" which were manufactured by [employer]. (Of note, we did contact our [facility], and they currently obtain their letrozole through [place], so Chloe Watkins could get her prescriptions filled there.) I have asked her to call us  in mid July with an update to let us know how she is doing and which medication she is taking.   With regards to her bone density, we are repeating the study in November at [facility]. I have strongly encouraged Chloe Watkins to have her dental work taken care of over the next couple of months, so that when she sees Dr. Jana Hakim in 6 months they can again consider initiating  bisphosphonate therapy.  In the meanwhile, she'll continue on both her vitamin D and calcium supplements. I also encouraged her to walk on a daily basis, preferably 30-40 minutes daily.  I have reviewed all of the above information with Chloe Watkins today, and she was also given a copy of this plan in writing. She voices both her understanding and agreement. She will call with any changes or problems prior to her appointment here in December.    Rondal Vandevelde  PA-C   10/15/2013

## 2014-02-19 ENCOUNTER — Encounter: Payer: Self-pay | Admitting: *Deleted

## 2014-04-14 ENCOUNTER — Other Ambulatory Visit (HOSPITAL_BASED_OUTPATIENT_CLINIC_OR_DEPARTMENT_OTHER): Payer: Medicare Other

## 2014-04-14 DIAGNOSIS — M8580 Other specified disorders of bone density and structure, unspecified site: Secondary | ICD-10-CM

## 2014-04-14 DIAGNOSIS — Z853 Personal history of malignant neoplasm of breast: Secondary | ICD-10-CM

## 2014-04-14 LAB — CBC WITH DIFFERENTIAL/PLATELET
BASO%: 0.7 % (ref 0.0–2.0)
Basophils Absolute: 0.1 10*3/uL (ref 0.0–0.1)
EOS%: 4.8 % (ref 0.0–7.0)
Eosinophils Absolute: 0.4 10*3/uL (ref 0.0–0.5)
HCT: 44.9 % (ref 34.8–46.6)
HEMOGLOBIN: 14.1 g/dL (ref 11.6–15.9)
LYMPH#: 1.5 10*3/uL (ref 0.9–3.3)
LYMPH%: 20.7 % (ref 14.0–49.7)
MCH: 28.5 pg (ref 25.1–34.0)
MCHC: 31.5 g/dL (ref 31.5–36.0)
MCV: 90.4 fL (ref 79.5–101.0)
MONO#: 0.8 10*3/uL (ref 0.1–0.9)
MONO%: 10.1 % (ref 0.0–14.0)
NEUT#: 4.7 10*3/uL (ref 1.5–6.5)
NEUT%: 63.7 % (ref 38.4–76.8)
Platelets: 217 10*3/uL (ref 145–400)
RBC: 4.96 10*6/uL (ref 3.70–5.45)
RDW: 14 % (ref 11.2–14.5)
WBC: 7.5 10*3/uL (ref 3.9–10.3)

## 2014-04-14 LAB — COMPREHENSIVE METABOLIC PANEL (CC13)
ALBUMIN: 3.8 g/dL (ref 3.5–5.0)
ALT: 17 U/L (ref 0–55)
AST: 21 U/L (ref 5–34)
Alkaline Phosphatase: 106 U/L (ref 40–150)
Anion Gap: 10 mEq/L (ref 3–11)
BUN: 11.2 mg/dL (ref 7.0–26.0)
CALCIUM: 9.6 mg/dL (ref 8.4–10.4)
CHLORIDE: 99 meq/L (ref 98–109)
CO2: 27 mEq/L (ref 22–29)
Creatinine: 0.8 mg/dL (ref 0.6–1.1)
EGFR: 68 mL/min/{1.73_m2} — ABNORMAL LOW (ref >90)
Glucose: 92 mg/dl (ref 70–140)
POTASSIUM: 4.5 meq/L (ref 3.5–5.1)
Sodium: 137 mEq/L (ref 136–145)
TOTAL PROTEIN: 7.1 g/dL (ref 6.4–8.3)
Total Bilirubin: 0.51 mg/dL (ref 0.20–1.20)

## 2014-04-15 LAB — VITAMIN D 25 HYDROXY (VIT D DEFICIENCY, FRACTURES): Vit D, 25-Hydroxy: 27 ng/mL — ABNORMAL LOW (ref 30–100)

## 2014-04-16 ENCOUNTER — Telehealth: Payer: Self-pay | Admitting: *Deleted

## 2014-04-16 ENCOUNTER — Encounter: Payer: Self-pay | Admitting: Oncology

## 2014-04-16 NOTE — Telephone Encounter (Signed)
Per Dr. Jana Hakim, patient called with Vitamin D results. Pt states she had stopped taking her vitamin D supplements for a few months because she was having some loose stools. She just restarted taking 2000 units Vitamin D daily a few days ago. Told pt to make sure she continues to take it daily. Pt agreeable to this and also reminded pt of her appt next week with Dr. Jana Hakim.

## 2014-04-21 ENCOUNTER — Telehealth: Payer: Self-pay | Admitting: Oncology

## 2014-04-21 ENCOUNTER — Ambulatory Visit (HOSPITAL_BASED_OUTPATIENT_CLINIC_OR_DEPARTMENT_OTHER): Payer: Medicare Other | Admitting: Oncology

## 2014-04-21 VITALS — BP 147/89 | HR 70 | Temp 98.3°F | Resp 18 | Ht 65.0 in | Wt 158.6 lb

## 2014-04-21 DIAGNOSIS — Z79811 Long term (current) use of aromatase inhibitors: Secondary | ICD-10-CM

## 2014-04-21 DIAGNOSIS — Z170 Estrogen receptor positive status [ER+]: Secondary | ICD-10-CM

## 2014-04-21 DIAGNOSIS — R600 Localized edema: Secondary | ICD-10-CM

## 2014-04-21 DIAGNOSIS — Z780 Asymptomatic menopausal state: Secondary | ICD-10-CM

## 2014-04-21 DIAGNOSIS — Z923 Personal history of irradiation: Secondary | ICD-10-CM

## 2014-04-21 DIAGNOSIS — E559 Vitamin D deficiency, unspecified: Secondary | ICD-10-CM

## 2014-04-21 DIAGNOSIS — C50412 Malignant neoplasm of upper-outer quadrant of left female breast: Secondary | ICD-10-CM

## 2014-04-21 DIAGNOSIS — M810 Age-related osteoporosis without current pathological fracture: Secondary | ICD-10-CM

## 2014-04-21 NOTE — Telephone Encounter (Signed)
per pof to sch pt appt-pt to get copy b4 leaving °

## 2014-04-21 NOTE — Progress Notes (Signed)
ID: Jettie Pagan   DOB: [birth year]  MR#: [id number]  SWN#:[id number]  HISTORY OF PRESENT ILLNESS: The patient is a 78 year old [place] woman who on 07/28/2010 had an unremarkable screening mammogram at the [facility]. In November 2012 however, when she saw her gynecologist, Rexford Maus, he noted a mass in her left breast and set her up for left mammography and ultrasonography, performed 04/12/2011. This showed a high density lesion with irregular margins in the upper outer quadrant of the left breast which was palpable and mobile. The axilla was unremarkable by palpation and ultrasonography. Biopsy of this mass was performed 04/21/2011, and showed [phone] an invasive ductal carcinoma, high-grade, estrogen receptor 100% positive, progesterone receptor 97% positive, with an MIB-1 of 27%, and no HER-2 amplification.  The patient then had a right diagnostic mammogram and 05/05/2011, which was unremarkable. Bilateral breast MRIs in 05/15/2011 showed a 2.1 cm enhancing mass in the upper outer left breast, with no other abnormal areas in either breast and no abnormal lymph nodes, bony or upper hepatic abnormalities noted.  On 05/24/2010, the patient underwent left lumpectomy and sentinel lymph node sampling under [facility]. This showed ([id number]) an invasive ductal carcinoma, grade 3, measuring 2.2 cm, with one of 2 sentinel lymph nodes involved by micrometastatic deposit. Margins were negative. Repeat HER-2 assessment was again negative. The patient's subsequent treatment is as detailed below.  INTERVAL HISTORY: Tayna returns today for followup of her left breast cancer. She tells me she had another bone density under Dr. Kandra Nicolas back. We do not have that report. She says it "still shows osteoporosis". She continues on letrozole, which she is tolerating well. She obtains it at a very good price.  REVIEW OF SYSTEMS: Klaire stopped taking her vitamins because she thought they might be causing her  some reflux. She gets leg cramps at night. She has significant problems with 13th, and tells me she needs to have some wisdom teeth pulled. She does not have insurance and cannot afford to have it done. She has back and joint pain, which is not more intense or persistent than before. Aside from these issues a detailed review of systems today was stable   PAST MEDICAL HISTORY: Past Medical History  Diagnosis Date  . Anemia   . Clotting disorder     hx low platelets  . Hypertension   . Osteoporosis   . Thyroid disease   . Chills   . Hearing loss   . Leg swelling   . Diarrhea   . Arthritis pain   . Headache(784.0)   . Bruises easily   . Breast cancer 04/21/2011    INVASIVE DUCTAL CA  . Arthritis   . PONV (postoperative nausea and vomiting)     did ok with last surgery 05/2011  . Shortness of breath     with exertion    PAST SURGICAL HISTORY: Past Surgical History  Procedure Laterality Date  . Vein ligation and stripping  1960  . Uterine fibroid surgery  1975  . Ankle surgery  2000  . Wrist surgery  2000    right  . Eye surgery      lt cataract  . Breast lumpectomy  05/25/11    L breast, node bx: inv ductal, dcis, 1/2 nodes pos, ER/PR +, HER 2 -  . Cholecystectomy  1970  . Orif wrist fracture  03/15/2012    Procedure: OPEN REDUCTION INTERNAL FIXATION (ORIF) WRIST FRACTURE;  Surgeon: Roseanne Kaufman, MD;  Location: [facility];  Service: Orthopedics;  Laterality: Left;    FAMILY HISTORY The patient's father died in his 83s from heart disease. The patient's mother died following a stroke at the age of 65. The patient had 4 sisters, one brother. The brother died from kidney cancer. One sister died 2 weeks ago from complications of diabetes. One sister died from melanoma at the age of 91. The 2 other sisters died secondary to strokes. There is no history of breast or ovarian cancer in the family.   GYNECOLOGIC HISTORY: GX P2, first pregnancy to term age 88. She had menopause age 70.  She took hormones very briefly (discontinued because of HTN)  SOCIAL HISTORY: The patient used to work for [employer], but became disabled secondary to her multiple back injuries. She is divorced and lives by herself. Her son Conception Doebler lives in [place] where he works at a Manufacturing systems engineer. Her daughter [name] lives in [place] a few miles from the patient. She is a Futures trader. The patient has 1 grandchild and 2 great-grandchildren. She is not a Advice worker.    ADVANCED DIRECTIVES: not in place  HEALTH MAINTENANCE: History  Substance Use Topics  . Smoking status: Former Smoker -- 1.00 packs/day for 15 years    Types: Cigarettes    Quit date: 05/07/1981  . Smokeless tobacco: Never Used  . Alcohol Use: No     Colonoscopy: never  PAP: UTD ([place])  Bone density: October of 2013, shows osteopenia, with a lowest T score at -1.8  Lipid panel:  Allergies  Allergen Reactions  . Penicillins Itching and Swelling    Current Outpatient Prescriptions  Medication Sig Dispense Refill  . Ascorbic Acid (VITAMIN C) 1000 MG tablet Take 1,000 mg by mouth 2 (two) times daily.    Marland Kitchen atenolol (TENORMIN) 50 MG tablet Take 50 mg by mouth daily.     . Calcium 500 MG CHEW Chew 3 tablets by mouth daily.    . cholecalciferol (VITAMIN D) 1000 UNITS tablet Take 1,000 Units by mouth 2 (two) times daily.    Marland Kitchen letrozole (FEMARA) 2.5 MG tablet Take 1 tablet (2.5 mg total) by mouth daily. 90 tablet 3  . MULTIPLE VITAMINS PO Take 1 tablet by mouth daily.     . Naproxen Sodium (ALEVE) 220 MG CAPS Take 1 capsule by mouth as needed. pain     No current facility-administered medications for this visit.    OBJECTIVE: Middle-aged white woman in no acute distress Filed Vitals:   04/21/14 1130  BP: 147/89  Pulse: 70  Temp: 98.3 F (36.8 C)  Resp: 18     Body mass index is 26.39 kg/(m^2).    ECOG FS: 1 Filed Weights   04/21/14 1130  Weight: 158 lb 9.6 oz (71.94 kg)   Sclerae unicteric, pupils  round and equal Oropharynx clear and moist No cervical or supraclavicular adenopathy Lungs no rales or rhonchi Heart regular rate and rhythm Abd soft, nontender, positive bowel sounds MSK significant scoliosis but no focal spinal tenderness, no upper extremity lymphedema Neuro: nonfocal, well oriented, anxious affect Breasts: The right breast is unremarkable. The left breast is status post lumpectomy and radiation. There is no evidence of local recurrence. Left axilla is benign.    LAB RESULTS: Results for KRISANN, MCKENNA (MRN [id number]) as of 04/21/2014 11:27  Ref. Range 10/25/2011 09:29 01/25/2012 09:16 01/20/2013 09:36 04/14/2014 09:28  Vit D, 25-Hydroxy Latest Range: 30-100 ng/mL 35 51 56 27 (L)    Lab Results  Component Value  Date   WBC 7.5 04/14/2014   NEUTROABS 4.7 04/14/2014   HGB 14.1 04/14/2014   HCT 44.9 04/14/2014   MCV 90.4 04/14/2014   PLT 217 04/14/2014      Chemistry      Component Value Date/Time   NA 137 04/14/2014 0928   NA 134* 03/15/2012 0635   K 4.5 04/14/2014 0928   K 3.7 03/15/2012 0635   CL 102 05/21/2012 1025   CL 98 03/15/2012 0635   CO2 27 04/14/2014 0928   CO2 24 03/15/2012 0635   BUN 11.2 04/14/2014 0928   BUN 11 03/15/2012 0635   CREATININE 0.8 04/14/2014 0928   CREATININE 0.77 03/15/2012 0635      Component Value Date/Time   CALCIUM 9.6 04/14/2014 0928   CALCIUM 9.2 03/15/2012 0635   ALKPHOS 106 04/14/2014 0928   ALKPHOS 87 10/25/2011 0929   AST 21 04/14/2014 0928   AST 22 10/25/2011 0929   ALT 17 04/14/2014 0928   ALT 16 10/25/2011 0929   BILITOT 0.51 04/14/2014 0928   BILITOT 0.7 10/25/2011 0929       Lab Results  Component Value Date   LABCA2 25 06/05/2011    STUDIES: Mammography November 2015 at [facility] was unremarkable  ASSESSMENT: 78 y.o.  [place] woman status post left lumpectomy and sentinel lymph node sampling 05/25/2011 for a T2 N1 (mic), Stage 2A invasive ductal carcinoma, grade 3, strongly estrogen and  progesterone receptor positive, HER-2 negative, with an MIB-1 of 27%.  (1) completed radiation therapy April 2013  (2) started letrozole in June 2013  (3) osteoporosis, with lowest T score on bone density 02/06/2012 at [facility] being -2.7   PLAN:  I spent approximately 40 minutes with Danton Clap today going over her situation. She could switch over to tamoxifen which would help her bones, whereas letrozole can of course further thin the bones. I gave her information on the possible side effects, toxicities and complications of tamoxifen and we went over the fact that the risk of blood clots is low and the risk of endometrial cancer exceedingly low.  Despite that she is terrified of switching and very much would prefer to continue on letrozole. We then went over the fact that she would benefit from bisphosphonate except she needs to have some wisdom teeth pulled. The reason she has not had this done is that she does not have dental insurance and she also doesn't have the $1500 she would need to have the teeth pulled. Because of this concern, I am reluctant to start her on bisphosphonates.  We did discuss her vitamin D level which is low. She stopped taking vitamin D at some point area she needs to resume it. I also gave her a copy of the [employer]. She would benefit from going to [place] on a regular basis and starting a program of weight bearing exercise.  As far as her reflux is concerned I suggested she start omeprazole 20 mg every evening.  She will see Dr. Zella Richer late May or June, she will see [place] again in December. The plan is to continue her visits on this pattern through June 2018, when she will "graduate" from breast cancer follow-up MAGRINAT,GUSTAV C    04/21/2014

## 2014-04-24 NOTE — Addendum Note (Signed)
Addended by: Laureen Abrahams on: 04/24/2014 05:42 PM   Modules accepted: Medications

## 2014-07-03 ENCOUNTER — Other Ambulatory Visit: Payer: Self-pay | Admitting: Emergency Medicine

## 2014-07-03 DIAGNOSIS — C50919 Malignant neoplasm of unspecified site of unspecified female breast: Secondary | ICD-10-CM

## 2014-07-03 MED ORDER — LETROZOLE 2.5 MG PO TABS: 2.5000 mg | ORAL_TABLET | Freq: Every day | ORAL | Status: DC

## 2015-04-08 ENCOUNTER — Other Ambulatory Visit: Payer: Self-pay

## 2015-04-08 DIAGNOSIS — C50412 Malignant neoplasm of upper-outer quadrant of left female breast: Secondary | ICD-10-CM

## 2015-04-12 ENCOUNTER — Other Ambulatory Visit: Payer: Medicare Other

## 2015-04-26 ENCOUNTER — Ambulatory Visit: Payer: Medicare Other | Admitting: Oncology

## 2015-05-12 MED FILL — ATENOLOL 50 MG TABLET: 50 | 100 days supply | Qty: 100 | Fill #2

## 2015-07-29 MED FILL — ATENOLOL 50 MG TABLET: 50 | 100 days supply | Qty: 100 | Fill #3

## 2015-08-30 MED FILL — CLOTRIMAZOLE-BETAMETHASONE: 1-0.05 | 15 days supply | Qty: 30 | Fill #0

## 2015-08-30 MED FILL — ATENOLOL 50 MG TABLET: 50 | 90 days supply | Qty: 90 | Fill #0

## 2015-10-13 ENCOUNTER — Other Ambulatory Visit: Payer: Self-pay | Admitting: Oncology

## 2015-10-14 ENCOUNTER — Telehealth: Payer: Self-pay | Admitting: Oncology

## 2015-10-14 NOTE — Telephone Encounter (Signed)
appt made and letter sent by mail °

## 2016-01-11 MED FILL — ATENOLOL 25 MG TABLET: 25 | 30 days supply | Qty: 60 | Fill #0

## 2016-01-28 MED FILL — ATENOLOL 50 MG TABLET: 50 | 90 days supply | Qty: 90 | Fill #0

## 2016-04-12 ENCOUNTER — Other Ambulatory Visit: Payer: Self-pay | Admitting: *Deleted

## 2016-04-12 DIAGNOSIS — C50412 Malignant neoplasm of upper-outer quadrant of left female breast: Secondary | ICD-10-CM

## 2016-04-13 ENCOUNTER — Ambulatory Visit: Payer: Self-pay | Admitting: Oncology

## 2016-04-13 ENCOUNTER — Other Ambulatory Visit: Payer: Self-pay

## 2016-04-15 ENCOUNTER — Encounter: Payer: Self-pay | Admitting: Oncology

## 2016-07-06 MED FILL — ATENOLOL 50 MG TABLET: 50 | 90 days supply | Qty: 90 | Fill #1

## 2016-09-08 MED FILL — ATENOLOL 50 MG TABLET: 50 | 90 days supply | Qty: 90 | Fill #0

## 2016-11-20 MED FILL — ATENOLOL 50 MG TABLET: 50 | 90 days supply | Qty: 90 | Fill #1

## 2017-02-16 MED FILL — ATENOLOL 50 MG TABLET: 50 | 90 days supply | Qty: 90 | Fill #2

## 2017-05-18 MED FILL — ATENOLOL 50 MG TABLET: 50 | 90 days supply | Qty: 90 | Fill #3

## 2017-07-19 ENCOUNTER — Encounter (HOSPITAL_COMMUNITY): Payer: Self-pay | Admitting: Emergency Medicine

## 2017-07-19 ENCOUNTER — Other Ambulatory Visit: Payer: Self-pay

## 2017-07-19 ENCOUNTER — Emergency Department (HOSPITAL_COMMUNITY)
Admission: EM | Admit: 2017-07-19 | Discharge: 2017-07-19 | Disposition: A | Discharge status: Home / Self Care | Payer: No Typology Code available for payment source | Attending: Emergency Medicine | Admitting: Emergency Medicine

## 2017-07-19 ENCOUNTER — Emergency Department (HOSPITAL_COMMUNITY): Payer: No Typology Code available for payment source

## 2017-07-19 DIAGNOSIS — Z853 Personal history of malignant neoplasm of breast: Secondary | ICD-10-CM | POA: Insufficient documentation

## 2017-07-19 DIAGNOSIS — Y939 Activity, unspecified: Secondary | ICD-10-CM | POA: Insufficient documentation

## 2017-07-19 DIAGNOSIS — Z87891 Personal history of nicotine dependence: Secondary | ICD-10-CM | POA: Insufficient documentation

## 2017-07-19 DIAGNOSIS — S161XXA Strain of muscle, fascia and tendon at neck level, initial encounter: Secondary | ICD-10-CM | POA: Diagnosis not present

## 2017-07-19 DIAGNOSIS — Y92410 Unspecified street and highway as the place of occurrence of the external cause: Secondary | ICD-10-CM | POA: Diagnosis not present

## 2017-07-19 DIAGNOSIS — I10 Essential (primary) hypertension: Secondary | ICD-10-CM | POA: Diagnosis not present

## 2017-07-19 DIAGNOSIS — Y998 Other external cause status: Secondary | ICD-10-CM | POA: Insufficient documentation

## 2017-07-19 DIAGNOSIS — S199XXA Unspecified injury of neck, initial encounter: Secondary | ICD-10-CM | POA: Diagnosis present

## 2017-07-19 DIAGNOSIS — Z79899 Other long term (current) drug therapy: Secondary | ICD-10-CM | POA: Insufficient documentation

## 2017-07-19 DIAGNOSIS — S20219A Contusion of unspecified front wall of thorax, initial encounter: Secondary | ICD-10-CM | POA: Diagnosis not present

## 2017-07-19 MED ORDER — IBUPROFEN 800 MG PO TABS
800.0000 mg | ORAL_TABLET | Freq: Once | ORAL | Status: AC
Start: 2017-07-19 — End: 2017-07-19
  Administered 2017-07-19: 800 mg via ORAL
  Filled 2017-07-19: qty 1

## 2017-07-19 MED ORDER — IBUPROFEN 600 MG PO TABS: 600.0000 mg | ORAL_TABLET | Freq: Four times a day (QID) | ORAL | 0 refills | Status: AC | PRN
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
## 2017-07-19 NOTE — Discharge Instructions (Signed)
Please take ibuprofen 600 mg 3 times a day as needed for pain or swelling Please be aware that your chest x-ray showed no signs of damage to your ribs, your lungs look normal. The CT scan of your neck showed no signs of fractures of your spine  You will likely feel poorly for the next several days as you have more body aches muscle aches neck pain and back pain.  If you should develop increasing chest pain or difficulty breathing please return to the emergency department for repeat evaluation immediately.  Please obtain all of your results from medical records or have your doctors office obtain the results - share them with your doctor - you should be seen at your doctors office in the next 2 days. Call today to arrange your follow up. Take the medications as prescribed. Please review all of the medicines and only take them if you do not have an allergy to them. Please be aware that if you are taking birth control pills, taking other prescriptions, ESPECIALLY ANTIBIOTICS may make the birth control ineffective - if this is the case, either do not engage in sexual activity or use alternative methods of birth control such as condoms until you have finished the medicine and your family doctor says it is OK to restart them. If you are on a blood thinner such as COUMADIN, be aware that any other medicine that you take may cause the coumadin to either work too much, or not enough - you should have your coumadin level rechecked in next 7 days if this is the case.  ?  It is also a possibility that you have an allergic reaction to any of the medicines that you have been prescribed - Everybody reacts differently to medications and while MOST people have no trouble with most medicines, you may have a reaction such as nausea, vomiting, rash, swelling, shortness of breath. If this is the case, please stop taking the medicine immediately and contact your physician.  ?  You should return to the ER if you develop severe or  worsening symptoms.

## 2017-07-30 ENCOUNTER — Emergency Department (HOSPITAL_COMMUNITY): Payer: No Typology Code available for payment source

## 2017-07-30 ENCOUNTER — Other Ambulatory Visit: Payer: Self-pay

## 2017-07-30 ENCOUNTER — Emergency Department (HOSPITAL_COMMUNITY)
Admission: EM | Admit: 2017-07-30 | Discharge: 2017-07-30 | Disposition: A | Discharge status: Home / Self Care | Payer: No Typology Code available for payment source | Attending: Emergency Medicine | Admitting: Emergency Medicine

## 2017-07-30 ENCOUNTER — Encounter (HOSPITAL_COMMUNITY): Payer: Self-pay | Admitting: *Deleted

## 2017-07-30 DIAGNOSIS — I10 Essential (primary) hypertension: Secondary | ICD-10-CM | POA: Diagnosis not present

## 2017-07-30 DIAGNOSIS — R0789 Other chest pain: Secondary | ICD-10-CM | POA: Insufficient documentation

## 2017-07-30 DIAGNOSIS — Z87891 Personal history of nicotine dependence: Secondary | ICD-10-CM | POA: Diagnosis not present

## 2017-07-30 DIAGNOSIS — Z79899 Other long term (current) drug therapy: Secondary | ICD-10-CM | POA: Diagnosis not present

## 2017-07-30 MED ORDER — HYDROCODONE-ACETAMINOPHEN 5-325 MG PO TABS
1.0000 | ORAL_TABLET | ORAL | 0 refills | Status: AC | PRN
Start: 2017-07-30 — End: ?

## 2017-07-30 NOTE — ED Notes (Signed)
Patient to Radiology
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
## 2017-07-30 NOTE — Discharge Instructions (Addendum)
Chest x-ray shows no fractured bones.  Prescription for pain medicine.  Follow-up with your primary care doctor.
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
## 2017-08-24 MED FILL — ATENOLOL 50 MG TABLET: 50 | 90 days supply | Qty: 90 | Fill #0

## 2017-08-31 ENCOUNTER — Other Ambulatory Visit: Payer: Self-pay | Admitting: Orthopedic Surgery

## 2017-08-31 ENCOUNTER — Ambulatory Visit
Admission: RE | Admit: 2017-08-31 | Discharge: 2017-08-31 | Disposition: A | Discharge status: Home / Self Care | Payer: Medicare Other | Source: Ambulatory Visit | Attending: Orthopedic Surgery | Admitting: Orthopedic Surgery

## 2017-08-31 DIAGNOSIS — K449 Diaphragmatic hernia without obstruction or gangrene: Secondary | ICD-10-CM | POA: Diagnosis not present

## 2017-08-31 DIAGNOSIS — I2510 Atherosclerotic heart disease of native coronary artery without angina pectoris: Secondary | ICD-10-CM | POA: Diagnosis not present

## 2017-08-31 DIAGNOSIS — I700 Atherosclerosis of aorta: Secondary | ICD-10-CM | POA: Insufficient documentation

## 2017-08-31 DIAGNOSIS — R0789 Other chest pain: Secondary | ICD-10-CM

## 2017-09-17 ENCOUNTER — Telehealth: Payer: Self-pay | Admitting: Surgery

## 2017-09-17 NOTE — Telephone Encounter (Signed)
-----   Message from Mickie Kay sent at 09/14/2017 10:20 AM EDT ----- Regarding: FW: referral Georgina Peer, Would you please schedule this patient with [facility] when he is back in the office. Hiatal Hernia ----- Message ----- From: Jules Husbands, MD Sent: 09/14/2017   9:01 AM To: Olean Ree, MD, [facility] Subject: RE: referral                                   I will be happy to see her [place] ----- Message ----- From: Olean Ree, MD Sent: 09/13/2017   4:40 PM To: Jules Husbands, MD, [facility] Subject: referral                                       Hi,  Just got a referral message from OSH for this patient with a hiatal hernia.  Just forwarding to see if anyone else wants to see this patient as I'll be gone and I dont really have much experience with hiatal hernias.  Thanks,  [employer]

## 2017-09-17 NOTE — Telephone Encounter (Signed)
Left a message for the patient to call the office. Patient needs an appointment schedule with Dr. Dahlia Byes.

## 2017-09-20 ENCOUNTER — Telehealth: Payer: Self-pay | Admitting: Surgery

## 2017-09-20 NOTE — Telephone Encounter (Signed)
I have left a message with Arvella Nigh nurse to give [place] a call back.

## 2017-09-20 NOTE — Telephone Encounter (Signed)
Patient has called back from a message that was left on her voicemail. Patient was referred from [place] to see one of our surgeons for a hiatal hernia. The patient was very unhappy with the calls that have been made to contact her for an appointment. She states that she does not know why she is being referred and that she has not even seen [facility] to discuss her results for the imaging. She stated that she was going to file a complaint with the [facility] Orthopedic department. She then stated, " do not call my phone again" and then hung up.   Please call the referring provider and speak with the nurse to inform them that the patient refused to make an appointment and with the information above.   I have canceled the referral.

## 2017-09-25 NOTE — Telephone Encounter (Signed)
Spoke with Crystal informing her of this information from the patient.

## 2017-10-17 ENCOUNTER — Ambulatory Visit: Payer: Self-pay | Admitting: Family Medicine

## 2017-11-29 MED FILL — ATENOLOL 50 MG TABLET: 50 | 90 days supply | Qty: 90 | Fill #1

## 2018-03-01 MED FILL — ATENOLOL 50 MG TABLET: 50 | 90 days supply | Qty: 90 | Fill #2

## 2018-05-29 MED FILL — ATENOLOL 50 MG TABLET: 50 | 90 days supply | Qty: 90 | Fill #3

## 2018-08-08 MED FILL — ATENOLOL 50 MG TABLET: 50 | 90 days supply | Qty: 90 | Fill #0

## 2018-11-22 MED FILL — ATENOLOL 50 MG TABLET: 50 | 90 days supply | Qty: 90 | Fill #0

## 2019-02-24 MED FILL — ATENOLOL 50 MG TABLET: 50 | 90 days supply | Qty: 90 | Fill #1

## 2019-06-13 MED FILL — ATENOLOL 50 MG TABLET: 50 | 90 days supply | Qty: 90 | Fill #0

## 2019-06-13 MED FILL — MELOXICAM 7.5 MG TABLET: 7.5 | 15 days supply | Qty: 15 | Fill #0

## 2019-07-02 MED FILL — MELOXICAM 7.5 MG TABLET: 7.5 | 30 days supply | Qty: 30 | Fill #0

## 2019-07-05 MED FILL — OFLOXACIN 0.3% EYE DROPS: 0.3 | 2 days supply | Qty: 5 | Fill #0

## 2019-09-09 MED FILL — ATENOLOL 50 MG TABLET: 50 | 90 days supply | Qty: 90 | Fill #0

## 2019-12-01 MED FILL — ATENOLOL 50 MG TABLET: 50 | 90 days supply | Qty: 90 | Fill #0

## 2020-01-26 ENCOUNTER — Other Ambulatory Visit (HOSPITAL_COMMUNITY): Payer: Self-pay | Admitting: Endocrinology

## 2020-01-26 MED FILL — ATENOLOL 50 MG TABLET: 50 | 90 days supply | Qty: 90 | Fill #0
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
## 2020-06-07 MED FILL — ATENOLOL 50 MG TABLET: 50 | 90 days supply | Qty: 90 | Fill #1

## 2020-09-15 ENCOUNTER — Other Ambulatory Visit (HOSPITAL_COMMUNITY): Payer: Self-pay

## 2020-09-15 MED ORDER — ATENOLOL 50 MG PO TABS
50.0000 mg | ORAL_TABLET | Freq: Every day | ORAL | 3 refills | Status: DC
  Filled 2020-09-15: qty 90, 90d supply, fill #0
  Filled 2020-12-22: qty 90, 90d supply, fill #1
  Filled 2021-03-14: qty 90, 90d supply, fill #2
  Filled 2021-06-20: qty 90, 90d supply, fill #3

## 2020-09-16 ENCOUNTER — Other Ambulatory Visit (HOSPITAL_COMMUNITY): Payer: Self-pay

## 2020-12-22 ENCOUNTER — Other Ambulatory Visit (HOSPITAL_COMMUNITY): Payer: Self-pay

## 2021-03-14 ENCOUNTER — Other Ambulatory Visit (HOSPITAL_COMMUNITY): Payer: Self-pay

## 2021-04-11 ENCOUNTER — Other Ambulatory Visit (HOSPITAL_COMMUNITY): Payer: Self-pay

## 2021-04-11 MED ORDER — LEVOTHYROXINE SODIUM 25 MCG PO TABS
ORAL_TABLET | ORAL | 1 refills | Status: AC
  Filled 2021-04-11: qty 30, 30d supply, fill #0

## 2021-04-12 ENCOUNTER — Other Ambulatory Visit (HOSPITAL_COMMUNITY): Payer: Self-pay

## 2021-04-12 MED ORDER — NITROFURANTOIN MONOHYD MACRO 100 MG PO CAPS
ORAL_CAPSULE | ORAL | 0 refills | Status: AC
  Filled 2021-04-12: qty 10, 5d supply, fill #0

## 2021-04-21 ENCOUNTER — Other Ambulatory Visit (HOSPITAL_COMMUNITY): Payer: Self-pay

## 2021-06-20 ENCOUNTER — Other Ambulatory Visit (HOSPITAL_COMMUNITY): Payer: Self-pay

## 2021-09-26 ENCOUNTER — Other Ambulatory Visit (HOSPITAL_COMMUNITY): Payer: Self-pay

## 2021-09-26 MED ORDER — ATENOLOL 50 MG PO TABS
50.0000 mg | ORAL_TABLET | Freq: Every day | ORAL | 3 refills | Status: DC
  Filled 2021-09-26: qty 90, 90d supply, fill #0
  Filled 2021-12-20: qty 90, 90d supply, fill #1
  Filled 2022-03-14: qty 90, 90d supply, fill #2
  Filled 2022-06-13 (×2): qty 90, 90d supply, fill #3

## 2021-12-20 ENCOUNTER — Other Ambulatory Visit (HOSPITAL_COMMUNITY): Payer: Self-pay

## 2022-03-14 ENCOUNTER — Other Ambulatory Visit (HOSPITAL_COMMUNITY): Payer: Self-pay

## 2022-03-15 ENCOUNTER — Other Ambulatory Visit (HOSPITAL_COMMUNITY): Payer: Self-pay

## 2022-06-13 ENCOUNTER — Other Ambulatory Visit (HOSPITAL_COMMUNITY): Payer: Self-pay

## 2022-09-13 ENCOUNTER — Other Ambulatory Visit (HOSPITAL_COMMUNITY): Payer: Self-pay

## 2022-09-14 ENCOUNTER — Other Ambulatory Visit (HOSPITAL_COMMUNITY): Payer: Self-pay

## 2022-09-14 MED ORDER — ATENOLOL 50 MG PO TABS
50.0000 mg | ORAL_TABLET | Freq: Every day | ORAL | 3 refills | Status: AC
  Filled 2022-09-14: qty 90, 90d supply, fill #0
  Filled 2022-12-14: qty 90, 90d supply, fill #1
  Filled 2023-03-15: qty 90, 90d supply, fill #2
  Filled 2023-06-04: qty 90, 90d supply, fill #3

## 2022-09-15 ENCOUNTER — Other Ambulatory Visit (HOSPITAL_COMMUNITY): Payer: Self-pay

## 2022-12-14 ENCOUNTER — Other Ambulatory Visit (HOSPITAL_COMMUNITY): Payer: Self-pay

## 2022-12-15 ENCOUNTER — Other Ambulatory Visit (HOSPITAL_COMMUNITY): Payer: Self-pay

## 2023-03-15 ENCOUNTER — Other Ambulatory Visit (HOSPITAL_COMMUNITY): Payer: Self-pay

## 2023-03-16 ENCOUNTER — Other Ambulatory Visit (HOSPITAL_COMMUNITY): Payer: Self-pay

## 2023-06-04 ENCOUNTER — Other Ambulatory Visit (HOSPITAL_COMMUNITY): Payer: Self-pay

## 2023-09-26 ENCOUNTER — Other Ambulatory Visit (HOSPITAL_COMMUNITY): Payer: Self-pay

## 2023-09-26 MED ORDER — ATENOLOL 50 MG PO TABS
50.0000 mg | ORAL_TABLET | Freq: Every day | ORAL | 3 refills | Status: AC
  Filled 2023-09-26: qty 90, 90d supply, fill #0
  Filled 2023-12-26: qty 90, 90d supply, fill #1
  Filled 2024-03-26: qty 90, 90d supply, fill #2

## 2023-09-27 ENCOUNTER — Other Ambulatory Visit (HOSPITAL_COMMUNITY): Payer: Self-pay

## 2023-12-26 ENCOUNTER — Other Ambulatory Visit (HOSPITAL_COMMUNITY): Payer: Self-pay

## 2024-03-26 ENCOUNTER — Other Ambulatory Visit (HOSPITAL_COMMUNITY): Payer: Self-pay
